# Patient Record
Sex: Male | Born: 1955 | Race: White | Hispanic: No | Smoking: Former smoker
Health system: Southern US, Community
[De-identification: ages and names within clinical notes are randomized; demographics above are authoritative.]

## PROBLEM LIST (undated history)

## (undated) DIAGNOSIS — G4730 Sleep apnea, unspecified: Secondary | ICD-10-CM

## (undated) DIAGNOSIS — Z87442 Personal history of urinary calculi: Secondary | ICD-10-CM

## (undated) DIAGNOSIS — H409 Unspecified glaucoma: Secondary | ICD-10-CM

## (undated) DIAGNOSIS — C210 Malignant neoplasm of anus, unspecified: Secondary | ICD-10-CM

## (undated) DIAGNOSIS — E039 Hypothyroidism, unspecified: Secondary | ICD-10-CM

## (undated) DIAGNOSIS — J449 Chronic obstructive pulmonary disease, unspecified: Secondary | ICD-10-CM

## (undated) HISTORY — DX: Malignant neoplasm of anus, unspecified: C21.0

## (undated) HISTORY — PX: SKIN GRAFT: SHX250

## (undated) HISTORY — DX: Hypothyroidism, unspecified: E03.9

## (undated) HISTORY — PX: LEG SURGERY: SHX1003

## (undated) HISTORY — PX: EYE SURGERY: SHX253

---

## 2010-12-04 DIAGNOSIS — C210 Malignant neoplasm of anus, unspecified: Secondary | ICD-10-CM | POA: Insufficient documentation

## 2010-12-04 HISTORY — DX: Malignant neoplasm of anus, unspecified: C21.0

## 2018-05-13 ENCOUNTER — Encounter: Payer: Self-pay | Admitting: Internal Medicine

## 2018-08-08 ENCOUNTER — Ambulatory Visit (INDEPENDENT_AMBULATORY_CARE_PROVIDER_SITE_OTHER): Payer: Medicare Other | Admitting: Gastroenterology

## 2018-08-08 ENCOUNTER — Telehealth: Payer: Self-pay

## 2018-08-08 ENCOUNTER — Encounter: Payer: Self-pay | Admitting: Gastroenterology

## 2018-08-08 ENCOUNTER — Other Ambulatory Visit: Payer: Self-pay

## 2018-08-08 DIAGNOSIS — C210 Malignant neoplasm of anus, unspecified: Secondary | ICD-10-CM

## 2018-08-08 DIAGNOSIS — Z860101 Personal history of adenomatous and serrated colon polyps: Secondary | ICD-10-CM

## 2018-08-08 DIAGNOSIS — K921 Melena: Secondary | ICD-10-CM | POA: Diagnosis not present

## 2018-08-08 DIAGNOSIS — R1310 Dysphagia, unspecified: Secondary | ICD-10-CM | POA: Diagnosis not present

## 2018-08-08 DIAGNOSIS — R1319 Other dysphagia: Secondary | ICD-10-CM

## 2018-08-08 DIAGNOSIS — Z86010 Personal history of colonic polyps: Secondary | ICD-10-CM | POA: Diagnosis not present

## 2018-08-08 MED ORDER — CLENPIQ 10-3.5-12 MG-GM -GM/160ML PO SOLN: 1.0000 | Freq: Once | ORAL | 0 refills | Status: AC

## 2018-08-08 NOTE — Patient Instructions (Signed)
Upper endoscopy and colonoscopy as scheduled. See separate instructions.  

## 2018-08-08 NOTE — Telephone Encounter (Signed)
Called and informed pt of pre-op appt 09/12/18 at 10:00am. Letter mailed.

## 2018-08-08 NOTE — Assessment & Plan Note (Signed)
Very pleasant 62 year old gentleman with presenting at the request of Dr. Quintin Alto for further evaluation of black loose stools.  He has history of anal cancer status post chemoradiation in 2012.  Last sigmoidoscopy in 2014 with tubular adenoma removed.  It is unclear when his last complete colonoscopy was. Patient denies surgical related to his anal cancer.  He complains of 2-3 black stools every morning, very loose and sticky, for the past 7 years.  Doubt we are dealing with chronic signficant bleeding.  There are no signs of significant anemia on exam.  Suspect related to his prior radiation. He is overdue for surveillance of his colon.  He also complains of solid food dysphagia.  Offered colonoscopy and upper endoscopy with esophageal dilation in the near future.  Given history of COPD, plan for deep sedation with help of anesthesiology.   I have discussed the risks, alternatives, benefits with regards to but not limited to the risk of reaction to medication, bleeding, infection, perforation and the patient is agreeable to proceed. Written consent to be obtained.
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
## 2018-08-08 NOTE — Progress Notes (Signed)
CC'D TO PCP °

## 2018-09-09 NOTE — Patient Instructions (Addendum)
Your procedure is scheduled on: 09/16/2018  Report to [facility] at    6:15 AM.  Call this number if you have problems the morning of surgery: [phone]   Remember:              Follow Directions on the letter you received from Your Physician's office regarding the Bowel Prep  :  Take these medicines the morning of surgery with A SIP OF WATER: None   Do not wear jewelry, make-up or nail polish.    Do not bring valuables to the hospital.  Contacts, dentures or bridgework may not be worn into surgery.  .   Patients discharged the day of surgery will not be allowed to drive home.     Colonoscopy, Adult, Care After This sheet gives you information about how to care for yourself after your procedure. Your health care provider may also give you more specific instructions. If you have problems or questions, contact your health care provider. What can I expect after the procedure? After the procedure, it is common to have:  A small amount of blood in your stool for 24 hours after the procedure.  Some gas.  Mild abdominal cramping or bloating.  Follow these instructions at home: General instructions   For the first 24 hours after the procedure: ? Do not drive or use machinery. ? Do not sign important documents. ? Do not drink alcohol. ? Do your regular daily activities at a slower pace than normal. ? Eat soft, easy-to-digest foods. ? Rest often.  Take over-the-counter or prescription medicines only as told by your health care provider.  It is up to you to get the results of your procedure. Ask your health care provider, or the department performing the procedure, when your results will be ready. Relieving cramping and bloating  Try walking around when you have cramps or feel bloated.  Apply heat to your abdomen as told by your health care provider. Use a heat source that your health care provider recommends, such as a moist heat pack or a heating pad. ? Place a towel  between your skin and the heat source. ? Leave the heat on for 20-30 minutes. ? Remove the heat if your skin turns bright red. This is especially important if you are unable to feel pain, heat, or cold. You may have a greater risk of getting burned. Eating and drinking  Drink enough fluid to keep your urine clear or pale yellow.  Resume your normal diet as instructed by your health care provider. Avoid heavy or fried foods that are hard to digest.  Avoid drinking alcohol for as long as instructed by your health care provider. Contact a health care provider if:  You have blood in your stool 2-3 days after the procedure. Get help right away if:  You have more than a small spotting of blood in your stool.  You pass large blood clots in your stool.  Your abdomen is swollen.  You have nausea or vomiting.  You have a fever.  You have increasing abdominal pain that is not relieved with medicine. This information is not intended to replace advice given to you by your health care provider. Make sure you discuss any questions you have with your health care provider. Document Released: 07/04/2004 Document Revised: 08/14/2016 Document Reviewed: 02/01/2016 Elsevier Interactive Patient Education  2018 [place]. Gastrointestinal Endoscopy, Care After Refer to this sheet in the next few weeks. These instructions provide you with information about  caring for yourself after your procedure. Your health care provider may also give you more specific instructions. Your treatment has been planned according to current medical practices, but problems sometimes occur. Call your health care provider if you have any problems or questions after your procedure. What can I expect after the procedure? After your procedure, it is common to feel:  Bloated.  Soreness in your throat.  Sleepy.  Follow these instructions at home:  Do not drive for 24 hours if you received a if you received a medicine to  help you relax (sedative).  Avoid drinking warm beverages and alcohol for the first 24 hours after the procedure.  Take over-the-counter and prescription medicines only as told by your health care provider.  Drink enough fluids to keep your urine clear or pale yellow.  If you feel bloated, try going for a walk. Walking may help the feeling go away.  If your throat is sore, try gargling with salt water. Get help right away if:  You have severe nausea or vomiting.  You have severe abdominal pain, abdominal cramps that last longer than 6 hours, or abdominal swelling.  You have severe shoulder or back pain.  You have trouble swallowing.  You have shortness of breath, your breathing is shallow, or you breathing is faster than normal.  You have a fever.  Your heart is beating very fast.  You vomit blood or material that looks like coffee grounds.  You have bloody, black, or tarry stools. This information is not intended to replace advice given to you by your health care provider. Make sure you discuss any questions you have with your health care provider. Document Released: 07/04/2004 Document Revised: 09/27/2016 Document Reviewed: 09/12/2015 Elsevier Interactive Patient Education  2018 [employer].  Esophageal Dilatation Esophageal dilatation is a procedure to open a blocked or narrowed part of the esophagus. The esophagus is the long tube in your throat that carries food and liquid from your mouth to your stomach. The procedure is also called esophageal dilation. You may need this procedure if you have a buildup of scar tissue in your esophagus that makes it difficult, painful, or even impossible to swallow. This can be caused by gastroesophageal reflux disease (GERD). In rare cases, people need this procedure because they have cancer of the esophagus or a problem with the way food moves through the esophagus. Sometimes you may need to have another dilatation to enlarge the opening  of the esophagus gradually. Tell a health care provider about:  Any allergies you have.  All medicines you are taking, including vitamins, herbs, eye drops, creams, and over-the-counter medicines.  Any problems you or family members have had with anesthetic medicines.  Any blood disorders you have.  Any surgeries you have had.  Any medical conditions you have.  Any antibiotic medicines you are required to take before dental procedures. What are the risks? Generally, this is a safe procedure. However, problems can occur and include:  Bleeding from a tear in the lining of the esophagus.  A hole (perforation) in the esophagus.  What happens before the procedure?  Do not eat or drink anything after midnight on the night before the procedure or as directed by your health care provider.  Ask your health care provider about changing or stopping your regular medicines. This is especially important if you are taking diabetes medicines or blood thinners.  Plan to have someone take you home after the procedure. What happens during the procedure?  You  will be given a medicine that makes you relaxed and sleepy (sedative).  A medicine may be sprayed or gargled to numb the back of the throat.  Your health care provider can use various instruments to do an esophageal dilatation. During the procedure, the instrument used will be placed in your mouth and passed down into your esophagus. Options include: ? Simple dilators. This instrument is carefully placed in the esophagus to stretch it. ? Guided wire bougies. In this method, a flexible tube (endoscope) is used to insert a wire into the esophagus. The dilator is passed over this wire to enlarge the esophagus. Then the wire is removed. ? Balloon dilators. An endoscope with a small balloon at the end is passed down into the esophagus. Inflating the balloon gently stretches the esophagus and opens it up. What happens after the procedure?  Your  blood pressure, heart rate, breathing rate, and blood oxygen level will be monitored often until the medicines you were given have worn off.  Your throat may feel slightly sore and will probably still feel numb. This will improve slowly over time.  You will not be allowed to eat or drink until the throat numbness has resolved.  If this is a same-day procedure, you may be allowed to go home once you have been able to drink, urinate, and sit on the edge of the bed without nausea or dizziness.  Monitored Anesthesia Care, Care After These instructions provide you with information about caring for yourself after your procedure. Your health care provider may also give you more specific instructions. Your treatment has been planned according to current medical practices, but problems sometimes occur. Call your health care provider if you have any problems or questions after your procedure. What can I expect after the procedure? After your procedure, it is common to: Feel sleepy for several hours. Feel clumsy and have poor balance for several hours. Feel forgetful about what happened after the procedure. Have poor judgment for several hours. Feel nauseous or vomit. Have a sore throat if you had a breathing tube during the procedure.  Follow these instructions at home: For at least 24 hours after the procedure:  Do not: Participate in activities in which you could fall or become injured. Drive. Use heavy machinery. Drink alcohol. Take sleeping pills or medicines that cause drowsiness. Make important decisions or sign legal documents. Take care of children on your own. Rest. Eating and drinking Follow the diet that is recommended by your health care provider. If you vomit, drink water, juice, or soup when you can drink without vomiting. Make sure you have little or no nausea before eating solid foods. General instructions Have a responsible adult stay with you until you are awake and  alert. Take over-the-counter and prescription medicines only as told by your health care provider. If you smoke, do not smoke without supervision. Keep all follow-up visits as told by your health care provider. This is important. Contact a health care provider if: You keep feeling nauseous or you keep vomiting. You feel light-headed. You develop a rash. You have a fever. Get help right away if: You have trouble breathing. This information is not intended to replace advice given to you by your health care provider. Make sure you discuss any questions you have with your health care provider. Document Released: 03/12/2016 Document Revised: 07/12/2016 Document Reviewed: 03/12/2016 Elsevier Interactive Patient Education  [employer].    If this is a same-day procedure, you should have a friend  or family member with you for the next 24 hours after the procedure. This information is not intended to replace advice given to you by your health care provider. Make sure you discuss any questions you have with your health care provider. Document Released: 01/11/2006 Document Revised: 04/27/2016 Document Reviewed: 04/01/2014 Elsevier Interactive Patient Education  2018 [place], Care After These instructions provide you with information about caring for yourself after your procedure. Your health care provider may also give you more specific instructions. Your treatment has been planned according to current medical practices, but problems sometimes occur. Call your health care provider if you have any problems or questions after your procedure. What can I expect after the procedure? After your procedure, it is common to:  Feel sleepy for several hours.  Feel clumsy and have poor balance for several hours.  Feel forgetful about what happened after the procedure.  Have poor judgment for several hours.  Feel nauseous or vomit.  Have a sore throat if you  had a breathing tube during the procedure.  Follow these instructions at home: For at least 24 hours after the procedure:   Do not: ? Participate in activities in which you could fall or become injured. ? Drive. ? Use heavy machinery. ? Drink alcohol. ? Take sleeping pills or medicines that cause drowsiness. ? Make important decisions or sign legal documents. ? Take care of children on your own.  Rest. Eating and drinking  Follow the diet that is recommended by your health care provider.  If you vomit, drink water, juice, or soup when you can drink without vomiting.  Make sure you have little or no nausea before eating solid foods. General instructions  Have a responsible adult stay with you until you are awake and alert.  Take over-the-counter and prescription medicines only as told by your health care provider.  If you smoke, do not smoke without supervision.  Keep all follow-up visits as told by your health care provider. This is important. Contact a health care provider if:  You keep feeling nauseous or you keep vomiting.  You feel light-headed.  You develop a rash.  You have a fever. Get help right away if:  You have trouble breathing. This information is not intended to replace advice given to you by your health care provider. Make sure you discuss any questions you have with your health care provider. Document Released: 03/12/2016 Document Revised: 07/12/2016 Document Reviewed: 03/12/2016 Elsevier Interactive Patient Education  [employer].

## 2018-09-12 ENCOUNTER — Encounter (HOSPITAL_COMMUNITY)
Admission: RE | Admit: 2018-09-12 | Discharge: 2018-09-12 | Disposition: A | Discharge status: Home / Self Care | Payer: Medicare Other | Source: Ambulatory Visit | Attending: Internal Medicine | Admitting: Internal Medicine

## 2018-09-12 ENCOUNTER — Other Ambulatory Visit: Payer: Self-pay

## 2018-09-12 ENCOUNTER — Encounter (HOSPITAL_COMMUNITY): Payer: Self-pay

## 2018-09-12 DIAGNOSIS — Z01818 Encounter for other preprocedural examination: Secondary | ICD-10-CM | POA: Insufficient documentation

## 2018-09-12 HISTORY — DX: Personal history of urinary calculi: Z87.442

## 2018-09-12 HISTORY — DX: Sleep apnea, unspecified: G47.30

## 2018-09-12 HISTORY — DX: Chronic obstructive pulmonary disease, unspecified: J44.9

## 2018-09-12 HISTORY — DX: Unspecified glaucoma: H40.9

## 2018-09-12 LAB — CBC WITH DIFFERENTIAL/PLATELET
ABS IMMATURE GRANULOCYTES: 0.03 10*3/uL (ref 0.00–0.07)
BASOS ABS: 0.1 10*3/uL (ref 0.0–0.1)
Basophils Relative: 1 %
EOS ABS: 0.1 10*3/uL (ref 0.0–0.5)
Eosinophils Relative: 1 %
HCT: 45.2 % (ref 39.0–52.0)
HEMOGLOBIN: 14.1 g/dL (ref 13.0–17.0)
IMMATURE GRANULOCYTES: 0 %
LYMPHS ABS: 0.6 10*3/uL — AB (ref 0.7–4.0)
LYMPHS PCT: 9 %
MCH: 30.7 pg (ref 26.0–34.0)
MCHC: 31.2 g/dL (ref 30.0–36.0)
MCV: 98.5 fL (ref 80.0–100.0)
MONOS PCT: 14 %
Monocytes Absolute: 0.9 10*3/uL (ref 0.1–1.0)
NEUTROS ABS: 5.1 10*3/uL (ref 1.7–7.7)
NRBC: 0 % (ref 0.0–0.2)
Neutrophils Relative %: 75 %
Platelets: 250 10*3/uL (ref 150–400)
RBC: 4.59 MIL/uL (ref 4.22–5.81)
RDW: 12.2 % (ref 11.5–15.5)
WBC: 6.7 10*3/uL (ref 4.0–10.5)

## 2018-09-12 LAB — BASIC METABOLIC PANEL
ANION GAP: 9 (ref 5–15)
BUN: 14 mg/dL (ref 6–20)
CALCIUM: 9 mg/dL (ref 8.9–10.3)
CO2: 28 mmol/L (ref 22–32)
Chloride: 101 mmol/L (ref 98–111)
Creatinine, Ser: 0.85 mg/dL (ref 0.61–1.24)
GFR calc non Af Amer: >60 mL/min (ref >60)
Glucose, Bld: 86 mg/dL (ref 70–99)
POTASSIUM: 3.6 mmol/L (ref 3.5–5.1)
Sodium: 138 mmol/L (ref 135–145)

## 2018-09-16 ENCOUNTER — Ambulatory Visit (HOSPITAL_COMMUNITY): Payer: Medicare Other | Admitting: Anesthesiology

## 2018-09-16 ENCOUNTER — Ambulatory Visit (HOSPITAL_COMMUNITY)
Admission: RE | Admit: 2018-09-16 | Discharge: 2018-09-16 | Disposition: A | Discharge status: Home / Self Care | Payer: Medicare Other | Source: Ambulatory Visit | Attending: Internal Medicine | Admitting: Internal Medicine

## 2018-09-16 ENCOUNTER — Other Ambulatory Visit: Payer: Self-pay

## 2018-09-16 ENCOUNTER — Encounter (HOSPITAL_COMMUNITY): Payer: Self-pay | Admitting: Anesthesiology

## 2018-09-16 ENCOUNTER — Encounter (HOSPITAL_COMMUNITY)
Admission: RE | Disposition: A | Discharge status: Home / Self Care | Payer: Self-pay | Source: Ambulatory Visit | Attending: Internal Medicine

## 2018-09-16 DIAGNOSIS — K2950 Unspecified chronic gastritis without bleeding: Secondary | ICD-10-CM | POA: Diagnosis not present

## 2018-09-16 DIAGNOSIS — Z86010 Personal history of colonic polyps: Secondary | ICD-10-CM | POA: Insufficient documentation

## 2018-09-16 DIAGNOSIS — K222 Esophageal obstruction: Secondary | ICD-10-CM | POA: Diagnosis not present

## 2018-09-16 DIAGNOSIS — D123 Benign neoplasm of transverse colon: Secondary | ICD-10-CM | POA: Diagnosis not present

## 2018-09-16 DIAGNOSIS — Z1211 Encounter for screening for malignant neoplasm of colon: Secondary | ICD-10-CM | POA: Diagnosis not present

## 2018-09-16 DIAGNOSIS — F17210 Nicotine dependence, cigarettes, uncomplicated: Secondary | ICD-10-CM | POA: Insufficient documentation

## 2018-09-16 DIAGNOSIS — Z9114 Patient's other noncompliance with medication regimen: Secondary | ICD-10-CM | POA: Insufficient documentation

## 2018-09-16 DIAGNOSIS — K449 Diaphragmatic hernia without obstruction or gangrene: Secondary | ICD-10-CM | POA: Diagnosis not present

## 2018-09-16 DIAGNOSIS — J449 Chronic obstructive pulmonary disease, unspecified: Secondary | ICD-10-CM | POA: Diagnosis not present

## 2018-09-16 DIAGNOSIS — G4730 Sleep apnea, unspecified: Secondary | ICD-10-CM | POA: Insufficient documentation

## 2018-09-16 DIAGNOSIS — Z85048 Personal history of other malignant neoplasm of rectum, rectosigmoid junction, and anus: Secondary | ICD-10-CM | POA: Insufficient documentation

## 2018-09-16 DIAGNOSIS — K219 Gastro-esophageal reflux disease without esophagitis: Secondary | ICD-10-CM | POA: Insufficient documentation

## 2018-09-16 DIAGNOSIS — K2970 Gastritis, unspecified, without bleeding: Secondary | ICD-10-CM | POA: Diagnosis not present

## 2018-09-16 DIAGNOSIS — K3189 Other diseases of stomach and duodenum: Secondary | ICD-10-CM | POA: Diagnosis not present

## 2018-09-16 DIAGNOSIS — E039 Hypothyroidism, unspecified: Secondary | ICD-10-CM | POA: Insufficient documentation

## 2018-09-16 HISTORY — PX: POLYPECTOMY: SHX5525

## 2018-09-16 HISTORY — PX: COLONOSCOPY WITH PROPOFOL: SHX5780

## 2018-09-16 HISTORY — PX: BIOPSY: SHX5522

## 2018-09-16 HISTORY — PX: ESOPHAGOGASTRODUODENOSCOPY (EGD) WITH PROPOFOL: SHX5813

## 2018-09-16 HISTORY — PX: MALONEY DILATION: SHX5535

## 2018-09-16 SURGERY — COLONOSCOPY WITH PROPOFOL
Anesthesia: Monitor Anesthesia Care

## 2018-09-16 MED ORDER — SUCCINYLCHOLINE CHLORIDE 20 MG/ML IJ SOLN
INTRAMUSCULAR | Status: AC
  Filled 2018-09-16: qty 1

## 2018-09-16 MED ORDER — GLYCOPYRROLATE 0.2 MG/ML IJ SOLN
INTRAMUSCULAR | Status: AC
  Filled 2018-09-16: qty 1

## 2018-09-16 MED ORDER — EPHEDRINE SULFATE 50 MG/ML IJ SOLN
INTRAMUSCULAR | Status: AC
  Filled 2018-09-16: qty 1

## 2018-09-16 MED ORDER — LIDOCAINE HCL (PF) 1 % IJ SOLN
INTRAMUSCULAR | Status: AC
  Filled 2018-09-16: qty 5

## 2018-09-16 MED ORDER — LACTATED RINGERS IV SOLN: INTRAVENOUS | Status: DC

## 2018-09-16 MED ORDER — GLYCOPYRROLATE 0.2 MG/ML IJ SOLN
INTRAMUSCULAR | Status: DC | PRN
  Administered 2018-09-16: 0.2 mg via INTRAVENOUS

## 2018-09-16 MED ORDER — LACTATED RINGERS IV SOLN
INTRAVENOUS | Status: DC | PRN
  Administered 2018-09-16: 08:00:00 via INTRAVENOUS

## 2018-09-16 MED ORDER — SODIUM CHLORIDE 0.9 % IJ SOLN
INTRAMUSCULAR | Status: AC
  Filled 2018-09-16: qty 10

## 2018-09-16 MED ORDER — PROPOFOL 500 MG/50ML IV EMUL
INTRAVENOUS | Status: DC | PRN
  Administered 2018-09-16: 150 ug/kg/min via INTRAVENOUS

## 2018-09-16 MED ORDER — LIDOCAINE HCL (CARDIAC) PF 50 MG/5ML IV SOSY
PREFILLED_SYRINGE | INTRAVENOUS | Status: DC | PRN
  Administered 2018-09-16: 40 mg via INTRAVENOUS

## 2018-09-16 MED ORDER — PROPOFOL 10 MG/ML IV BOLUS
INTRAVENOUS | Status: DC | PRN
  Administered 2018-09-16 (×2): 30 mg via INTRAVENOUS

## 2018-09-16 MED ORDER — PROPOFOL 10 MG/ML IV BOLUS
INTRAVENOUS | Status: AC
  Filled 2018-09-16: qty 140

## 2018-09-16 MED ORDER — PROPOFOL 10 MG/ML IV BOLUS
INTRAVENOUS | Status: AC
  Filled 2018-09-16: qty 80
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
## 2018-09-16 NOTE — Op Note (Signed)
[facility] Patient Name: Joel Cobb Procedure Date: 09/16/2018 7:48 AM MRN: [id number] Date of Birth: 11-Feb-1956 Attending MD: Norvel Richards , MD CSN: [id number] Age: 62 Admit Type: Outpatient Procedure:                Colonoscopy Indications:              High risk colon cancer surveillance: Personal                            history of colonic polyps Providers:                Norvel Richards, MD, Janeece Riggers, RN, Nelma Rothman, Technician Referring MD:              Medicines:                Propofol per Anesthesia Complications:            No immediate complications. Estimated Blood Loss:     Estimated blood loss was minimal. Procedure:                Pre-Anesthesia Assessment:                           - Prior to the procedure, a History and Physical                            was performed, and patient medications and                            allergies were reviewed. The patient's tolerance of                            previous anesthesia was also reviewed. The risks                            and benefits of the procedure and the sedation                            options and risks were discussed with the patient.                            All questions were answered, and informed consent                            was obtained. Prior Anticoagulants: The patient has                            taken no previous anticoagulant or antiplatelet                            agents. ASA Grade Assessment: II - A patient with  mild systemic disease. After reviewing the risks                            and benefits, the patient was deemed in                            satisfactory condition to undergo the procedure.                           After obtaining informed consent, the colonoscope                            was passed under direct vision. Throughout the                            procedure, the patient's  blood pressure, pulse, and                            oxygen saturations were monitored continuously. The                            CF-HQ190L ([id number]) scope was introduced through                            the and advanced to the the cecum, identified by                            appendiceal orifice and ileocecal valve. The                            colonoscopy was performed without difficulty. The                            patient tolerated the procedure well. The quality                            of the bowel preparation was adequate. The                            ileocecal valve, appendiceal orifice, and rectum                            were photographed. The entire colon was well                            visualized. Scope In: 7:52:32 AM Scope Out: 8:13:17 AM Scope Withdrawal Time: 0 hours 10 minutes 11 seconds  Total Procedure Duration: 0 hours 20 minutes 45 seconds  Findings:      The perianal and digital rectal examinations were normal. Some perianal       scarring presumably from radiation noted. An Acute turn in the mid       sigmoid necessitated obtaining the pediatric scope to advance to the       cecum.      A 7 mm polyp was  found in the hepatic flexure. The polyp was sessile.       The polyp was removed with a cold snare. Resection and retrieval were       complete. Estimated blood loss was minimal.      The exam was otherwise without abnormality on direct views. Rectal vault       appeared somewhat small. Again, distal scarring presumably from       radiation noted with minimal neovascular changes. Did not attempt       retroflexed. No evidence of persisting tumor. Otherwise, rectal mucosa       appeared normal. And retroflexion views. Impression:               - One 7 mm polyp at the hepatic flexure, removed                            with a cold snare. Resected and retrieved.                           - The examination was otherwise normal on direct                             and retroflexion views. Anorectal scarring as                            outlined above Moderate Sedation:      Moderate (conscious) sedation was personally administered by an       anesthesia professional. The following parameters were monitored: oxygen       saturation, heart rate, blood pressure, respiratory rate, EKG, adequacy       of pulmonary ventilation, and response to care. Recommendation:           - Patient has a contact number available for                            emergencies. The signs and symptoms of potential                            delayed complications were discussed with the                            patient. Return to normal activities tomorrow.                            Written discharge instructions were provided to the                            patient.                           - Advance diet as tolerated.                           - Continue present medications.                           - Repeat colonoscopy date to be determined after  pending pathology results are reviewed for                            surveillance based on pathology results.                           - Return to GI office in 3 months. See EGD report. Procedure Code(s):        --- Professional ---                           [phone], Colonoscopy, flexible; with removal of                            tumor(s), polyp(s), or other lesion(s) by snare                            technique Diagnosis Code(s):        --- Professional ---                           Z86.010, Personal history of colonic polyps                           D12.3, Benign neoplasm of transverse colon (hepatic                            flexure or splenic flexure) CPT copyright 2018 American Medical Association. All rights reserved. The codes documented in this report are preliminary and upon coder review may  be revised to meet current compliance requirements. Cristopher Estimable. Voshon Petro,  MD Norvel Richards, MD 09/16/2018 8:23:49 AM This report has been signed electronically. Number of Addenda: 0

## 2018-09-16 NOTE — Addendum Note (Signed)
Addendum  created 09/16/18 0826 by Nicanor Alcon, MD   Attestation recorded in [place], [place] filed

## 2018-09-16 NOTE — Anesthesia Procedure Notes (Signed)
Procedure Name: MAC Date/Time: 09/16/2018 7:32 AM Performed by: Andree Elk Amy A, CRNA Pre-anesthesia Checklist: Patient identified, Emergency Drugs available, Suction available, Patient being monitored and Timeout performed Oxygen Delivery Method: Simple face mask

## 2018-09-16 NOTE — Discharge Instructions (Signed)
EGD Discharge instructions Please read the instructions outlined below and refer to this sheet in the next few weeks. These discharge instructions provide you with general information on caring for yourself after you leave the hospital. Your doctor may also give you specific instructions. While your treatment has been planned according to the most current medical practices available, unavoidable complications occasionally occur. If you have any problems or questions after discharge, please call your doctor. ACTIVITY  You may resume your regular activity but move at a slower pace for the next 24 hours.   Take frequent rest periods for the next 24 hours.   Walking will help expel (get rid of) the air and reduce the bloated feeling in your abdomen.   No driving for 24 hours (because of the anesthesia (medicine) used during the test).   You may shower.   Do not sign any important legal documents or operate any machinery for 24 hours (because of the anesthesia used during the test).  NUTRITION  Drink plenty of fluids.   You may resume your normal diet.   Begin with a light meal and progress to your normal diet.   Avoid alcoholic beverages for 24 hours or as instructed by your caregiver.  MEDICATIONS  You may resume your normal medications unless your caregiver tells you otherwise.  WHAT YOU CAN EXPECT TODAY  You may experience abdominal discomfort such as a feeling of fullness or gas pains.  FOLLOW-UP  Your doctor will discuss the results of your test with you.  SEEK IMMEDIATE MEDICAL ATTENTION IF ANY OF THE FOLLOWING OCCUR:  Excessive nausea (feeling sick to your stomach) and/or vomiting.   Severe abdominal pain and distention (swelling).   Trouble swallowing.   Temperature over 101 F (37.8 C).   Rectal bleeding or vomiting of blood.   Colonoscopy Discharge Instructions  Read the instructions outlined below and refer to this sheet in the next few weeks. These  discharge instructions provide you with general information on caring for yourself after you leave the hospital. Your doctor may also give you specific instructions. While your treatment has been planned according to the most current medical practices available, unavoidable complications occasionally occur. If you have any problems or questions after discharge, call Dr. Gala Romney at [phone]. ACTIVITY  You may resume your regular activity, but move at a slower pace for the next 24 hours.   Take frequent rest periods for the next 24 hours.   Walking will help get rid of the air and reduce the bloated feeling in your belly (abdomen).   No driving for 24 hours (because of the medicine (anesthesia) used during the test).    Do not sign any important legal documents or operate any machinery for 24 hours (because of the anesthesia used during the test).  NUTRITION  Drink plenty of fluids.   You may resume your normal diet as instructed by your doctor.   Begin with a light meal and progress to your normal diet. Heavy or fried foods are harder to digest and may make you feel sick to your stomach (nauseated).   Avoid alcoholic beverages for 24 hours or as instructed.  MEDICATIONS  You may resume your normal medications unless your doctor tells you otherwise.  WHAT YOU CAN EXPECT TODAY  Some feelings of bloating in the abdomen.   Passage of more gas than usual.   Spotting of blood in your stool or on the toilet paper.  IF YOU HAD POLYPS REMOVED DURING THE COLONOSCOPY:  No aspirin products for 7 days or as instructed.   No alcohol for 7 days or as instructed.   Eat a soft diet for the next 24 hours.  FINDING OUT THE RESULTS OF YOUR TEST Not all test results are available during your visit. If your test results are not back during the visit, make an appointment with your caregiver to find out the results. Do not assume everything is normal if you have not heard from your caregiver or the  medical facility. It is important for you to follow up on all of your test results.  SEEK IMMEDIATE MEDICAL ATTENTION IF:  You have more than a spotting of blood in your stool.   Your belly is swollen (abdominal distention).   You are nauseated or vomiting.   You have a temperature over 101.   You have abdominal pain or discomfort that is severe or gets worse throughout the day.    GERD and colon polyp information provided  Begin Protonix 40 mg daily  Office visit in 3 months  Further recommendations to follow pending review of pathology report

## 2018-09-16 NOTE — Transfer of Care (Signed)
Immediate Anesthesia Transfer of Care Note  Patient: Joel Cobb  Procedure(s) Performed: COLONOSCOPY WITH PROPOFOL (N/A ) ESOPHAGOGASTRODUODENOSCOPY (EGD) WITH PROPOFOL (N/A ) MALONEY DILATION (N/A ) BIOPSY POLYPECTOMY  Patient Location: PACU  Anesthesia Type:MAC  Level of Consciousness: awake, alert  and oriented  Airway & Oxygen Therapy: Patient Spontanous Breathing  Post-op Assessment: Report given to RN and Post -op Vital signs reviewed and stable  Post vital signs: Reviewed and stable  Last Vitals:  Vitals Value Taken Time  BP    Temp    Pulse 39 09/16/2018  8:21 AM  Resp    SpO2 90 % 09/16/2018  8:21 AM  Vitals shown include unvalidated device data.  Last Pain:  Vitals:   09/16/18 0735  TempSrc:   PainSc: 4       Patients Stated Pain Goal: 8 ([id number])  Complications: No apparent anesthesia complications

## 2018-09-16 NOTE — Op Note (Signed)
[facility] Patient Name: Joel Cobb Procedure Date: 09/16/2018 7:32 AM MRN: [id number] Date of Birth: [birth year] Attending MD: Norvel Richards , MD CSN: [id number] Age: 62 Admit Type: Outpatient Procedure:                Upper GI endoscopy Indications:              Dysphagia, Melena Providers:                Norvel Richards, MD, Janeece Riggers, RN, Nelma Rothman, Technician Referring MD:              Medicines:                Propofol per Anesthesia Complications:            No immediate complications. Estimated Blood Loss:     Estimated blood loss was minimal. Procedure:                Pre-Anesthesia Assessment:                           - Prior to the procedure, a History and Physical                            was performed, and patient medications and                            allergies were reviewed. The patient's tolerance of                            previous anesthesia was also reviewed. The risks                            and benefits of the procedure and the sedation                            options and risks were discussed with the patient.                            All questions were answered, and informed consent                            was obtained. Prior Anticoagulants: The patient has                            taken no previous anticoagulant or antiplatelet                            agents. ASA Grade Assessment: II - A patient with                            mild systemic disease. After reviewing the risks  and benefits, the patient was deemed in                            satisfactory condition to undergo the procedure.                           After obtaining informed consent, the endoscope was                            passed under direct vision. Throughout the                            procedure, the patient's blood pressure, pulse, and                            oxygen saturations were  monitored continuously. The                            [id number]-[id number] ([id number]) scope was introduced through the                            and advanced to the second part of duodenum. The                            upper GI endoscopy was accomplished without                            difficulty. The patient tolerated the procedure                            well. Scope In: 7:38:31 AM Scope Out: 8:25:05 AM Total Procedure Duration: 0 hours 7 minutes 43 seconds  Findings:      Schatzki's ring present. No tumor. No Barrett's epithelium. No       esophagitis. Small hiatal hernia.      Two localized 7 mm erosions were found in the gastric antrum.      The duodenal bulb and second portion of the duodenum were normal. The       scope was withdrawn. A 56 French Maloney dilator passed with mild       resistance. Subsequently, a 68 French dilator was passed with mild       resistance. A look back revealed nice disruption without apparent       complication. Finally, abnormal gastric mucosa was biopsied with cold       forceps for histology. Estimated blood loss was minimal. Impression:               - Mild Schatzki ring. Dilated                           - Erosive gastropathy. Biopsied.                           - Normal duodenal bulb and second portion of the  duodenum. Moderate Sedation:      Moderate (conscious) sedation was personally administered by an       anesthesia professional. The following parameters were monitored: oxygen       saturation, heart rate, blood pressure, and response to care. Recommendation:           - Patient has a contact number available for                            emergencies. The signs and symptoms of potential                            delayed complications were discussed with the                            patient. Return to normal activities tomorrow.                            Written discharge instructions were provided to the                             patient.                           - Resume previous diet.                           - Continue present medications. Begin Protonix 40                            mg daily. See colonoscopy report.                           - Await pathology results.                           - No repeat upper endoscopy.                           - Return to GI office in 3 months. Procedure Code(s):        --- Professional ---                           [phone], Esophagogastroduodenoscopy, flexible,                            transoral; with biopsy, single or multiple Diagnosis Code(s):        --- Professional ---                           K22.2, Esophageal obstruction                           K31.89, Other diseases of stomach and duodenum                           R13.10, Dysphagia, unspecified  K92.1, Melena (includes Hematochezia) CPT copyright 2018 American Medical Association. All rights reserved. The codes documented in this report are preliminary and upon coder review may  be revised to meet current compliance requirements. Cristopher Estimable. Benjaman Artman, MD Norvel Richards, MD 09/16/2018 7:49:59 AM This report has been signed electronically. Number of Addenda: 0

## 2018-09-16 NOTE — Anesthesia Preprocedure Evaluation (Signed)
Anesthesia Evaluation  Patient identified by MRN, date of birth, ID band Patient awake    Reviewed: Allergy & Precautions, H&P , NPO status , Patient's Chart, lab work & pertinent test results, reviewed documented beta blocker date and time   Airway Mallampati: I  TM Distance: >3 FB Neck ROM: full    Dental  (+) Edentulous Upper, Edentulous Lower   Pulmonary sleep apnea , COPD, Current Smoker,    Pulmonary exam normal breath sounds clear to auscultation       Cardiovascular Exercise Tolerance: Good negative cardio ROS   Rhythm:regular Rate:Normal     Neuro/Psych negative neurological ROS  negative psych ROS   GI/Hepatic negative GI ROS, Neg liver ROS,   Endo/Other  Hypothyroidism   Renal/GU negative Renal ROS  negative genitourinary   Musculoskeletal   Abdominal   Peds  Hematology negative hematology ROS (+)   Anesthesia Other Findings Anal cancer (HCC)  Reproductive/Obstetrics negative OB ROS                             Anesthesia Physical Anesthesia Plan  ASA: III  Anesthesia Plan: MAC   Post-op Pain Management:    Induction:   PONV Risk Score and Plan:   Airway Management Planned:   Additional Equipment:   Intra-op Plan:   Post-operative Plan:   Informed Consent: I have reviewed the patients History and Physical, chart, labs and discussed the procedure including the risks, benefits and alternatives for the proposed anesthesia with the patient or authorized representative who has indicated his/her understanding and acceptance.   Dental Advisory Given  Plan Discussed with: CRNA  Anesthesia Plan Comments:         Anesthesia Quick Evaluation

## 2018-09-16 NOTE — Anesthesia Postprocedure Evaluation (Signed)
Anesthesia Post Note  Patient: Kanan Sobek  Procedure(s) Performed: COLONOSCOPY WITH PROPOFOL (N/A ) ESOPHAGOGASTRODUODENOSCOPY (EGD) WITH PROPOFOL (N/A ) MALONEY DILATION (N/A ) BIOPSY POLYPECTOMY  Patient location during evaluation: PACU Anesthesia Type: MAC Level of consciousness: awake and alert and oriented Pain management: pain level controlled Vital Signs Assessment: post-procedure vital signs reviewed and stable Respiratory status: spontaneous breathing Cardiovascular status: stable Postop Assessment: no apparent nausea or vomiting Anesthetic complications: no     Last Vitals:  Vitals:   09/16/18 0714  Temp: 36.8 C    Last Pain:  Vitals:   09/16/18 0735  TempSrc:   PainSc: 4                  ADAMS, AMY A

## 2018-09-23 ENCOUNTER — Encounter (HOSPITAL_COMMUNITY): Payer: Self-pay | Admitting: Internal Medicine

## 2018-09-24 ENCOUNTER — Encounter: Payer: Self-pay | Admitting: Internal Medicine

## 2018-10-09 ENCOUNTER — Encounter: Payer: Self-pay | Admitting: Internal Medicine

## 2019-01-03 ENCOUNTER — Ambulatory Visit (INDEPENDENT_AMBULATORY_CARE_PROVIDER_SITE_OTHER): Payer: Medicare Other | Admitting: Gastroenterology

## 2019-01-03 ENCOUNTER — Encounter: Payer: Self-pay | Admitting: Gastroenterology

## 2019-01-03 ENCOUNTER — Encounter: Payer: Self-pay | Admitting: *Deleted

## 2019-01-03 VITALS — BP 155/101 | HR 90 | Temp 96.6°F | Ht 69.0 in | Wt 136.0 lb

## 2019-01-03 DIAGNOSIS — R1310 Dysphagia, unspecified: Secondary | ICD-10-CM

## 2019-01-03 DIAGNOSIS — R1319 Other dysphagia: Secondary | ICD-10-CM

## 2019-01-03 DIAGNOSIS — K921 Melena: Secondary | ICD-10-CM | POA: Diagnosis not present

## 2019-01-03 NOTE — Patient Instructions (Signed)
1. Continue pantoprazole once daily to help heal the lining of your stomach and protect from ulcers.  2. Limit Aleve, Goody's/BCs, ibuprofen/Advil as they can lead to ulcers and bleeding. 3. Please have your labs and xray done. We will be in touch with results.

## 2019-01-03 NOTE — Progress Notes (Signed)
Primary Care Physician: Manon Hilding, MD  Primary Gastroenterologist:  Garfield Cornea, MD   Chief Complaint  Patient presents with  . Dysphagia    food gets stuck in throat  . Melena    HPI: Joel Cobb is a 63 y.o. male here for follow up.  Was seen back in September with complaints of black loose stools, dysphagia.  Here no improvement in swallowing. Pills sticking in the throat.  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  Underwent chemo and radiation, completed October 2012.  Flex sig September 2014 at [facility], 6 mm sessile polyp in the distal sigmoid, tubular adenoma.  No diverticulosis.  Tight angulation noted in the sigmoid colon requiring therapeutic EGD scope to complete exam.  There was mild radiation changes with it angiectasia at the dentate line, involving very little distal rectal tissue and more of the squamous mucosa of the anal canal.  Recent EGD with mild Schatzki ring status post dilation, erosive gastropathy with benign biopsies.  Colonoscopy with single lower adenoma removed from the hepatic flexure.  Next colonoscopy planned for 5 years.  Patient states he is really not any better.  Continues to have difficulty swallowing.  Symptoms back the very next day after dilation.  Bowel movement about every other day, solid to soft stool.  Continues to be black.  No bright red blood per rectum.  Chronic suprapubic abdominal pain since radiation.  No nausea or vomiting.  Denies heartburn.  Currently on Protonix.  Takes BCs or Goody's or Aleve when needed for pain, currently taking mostly Aleve about once daily.  Patient states most of his problems are financial.  He has trouble making ends meet.  Particularly running out of funds for food towards the end of the month.  Has been able to maintain his weight.  Lives on his son's property, lives in a camper.  Denies getting any type of support from his son.  Has reached out for  public assistance.   Current Outpatient Medications  Medication Sig Dispense Refill  . pantoprazole (PROTONIX) 40 MG tablet Take 1 tablet by mouth daily.     No current facility-administered medications for this visit.     Allergies as of 01/03/2019  . (No Known Allergies)    ROS:  General: Negative for anorexia, weight loss, fever, chills, fatigue, weakness. ENT: Negative for hoarseness,   nasal congestion. See hpi CV: Negative for chest pain, angina, palpitations, dyspnea on exertion, peripheral edema.  Respiratory: Negative for dyspnea at rest, dyspnea on exertion, cough, sputum, wheezing.  GI: See history of present illness. GU:  Negative for dysuria, hematuria, urinary incontinence, urinary frequency, nocturnal urination.  Endo: Negative for unusual weight change.    Physical Examination:   BP (!) 155/101   Pulse 90   Temp (!) 96.6 F (35.9 C) (Oral)   Ht 5\' 9"  (1.753 m)   Wt 136 lb (61.7 kg)   BMI 20.08 kg/m   General: Well-nourished, well-developed in no acute distress.  Eyes: No icterus. Mouth: Oropharyngeal mucosa moist and pink , no lesions erythema or exudate. Lungs: Clear to auscultation bilaterally.  Heart: Regular rate and rhythm, no murmurs rubs or gallops.  Abdomen: Bowel sounds are normal, nontender, nondistended, no hepatosplenomegaly or masses, no abdominal bruits or hernia , no rebound or guarding.   Extremities: No lower extremity edema. No clubbing or deformities. Neuro: Alert and oriented x 4   Skin:  Warm and dry, no jaundice.   Psych: Alert and cooperative, normal mood and affect.  Labs:  Lab Results  Component Value Date   CREATININE 0.85 09/12/2018   BUN 14 09/12/2018   NA 138 09/12/2018   K 3.6 09/12/2018   CL 101 09/12/2018   CO2 28 09/12/2018   Lab Results  Component Value Date   WBC 6.7 09/12/2018   HGB 14.1 09/12/2018   HCT 45.2 09/12/2018   MCV 98.5 09/12/2018   PLT 250 09/12/2018    Imaging Studies: No results  found.

## 2019-01-07 ENCOUNTER — Encounter: Payer: Self-pay | Admitting: Gastroenterology

## 2019-01-07 ENCOUNTER — Other Ambulatory Visit (HOSPITAL_COMMUNITY)
Admission: RE | Admit: 2019-01-07 | Discharge: 2019-01-07 | Disposition: A | Discharge status: Home / Self Care | Payer: Medicare Other | Source: Ambulatory Visit | Attending: Gastroenterology | Admitting: Gastroenterology

## 2019-01-07 ENCOUNTER — Ambulatory Visit (HOSPITAL_COMMUNITY)
Admission: RE | Admit: 2019-01-07 | Discharge: 2019-01-07 | Disposition: A | Discharge status: Home / Self Care | Payer: Medicare Other | Source: Ambulatory Visit | Attending: Gastroenterology | Admitting: Gastroenterology

## 2019-01-07 DIAGNOSIS — K921 Melena: Secondary | ICD-10-CM | POA: Diagnosis present

## 2019-01-07 DIAGNOSIS — R1310 Dysphagia, unspecified: Secondary | ICD-10-CM | POA: Diagnosis not present

## 2019-01-07 DIAGNOSIS — R1319 Other dysphagia: Secondary | ICD-10-CM

## 2019-01-07 LAB — CBC
HCT: 43.2 % (ref 39.0–52.0)
HEMOGLOBIN: 13.2 g/dL (ref 13.0–17.0)
MCH: 29.8 pg (ref 26.0–34.0)
MCHC: 30.6 g/dL (ref 30.0–36.0)
MCV: 97.5 fL (ref 80.0–100.0)
NRBC: 0 % (ref 0.0–0.2)
Platelets: 251 10*3/uL (ref 150–400)
RBC: 4.43 MIL/uL (ref 4.22–5.81)
RDW: 13.2 % (ref 11.5–15.5)
WBC: 6.4 10*3/uL (ref 4.0–10.5)

## 2019-01-07 NOTE — Assessment & Plan Note (Signed)
Very pleasant 63 year old gentleman complaining of persistent solid food dysphagia status post esophageal dilation back in October.  States he had relief of symptoms for about 24 hours but then they returned.  Continues to complain of melena.  This is been going on for months.  Denies any Pepto or other bismuth products.  Denies iron.  Hemoglobin was stable on recheck back in October for the same complaint.  Doubt we are dealing with a true melena.  Advised him to continue PPI especially as long as he is taking NSAIDs/ASA.   Offered him barium pill esophagram to further evaluate dysphagia.  We will update his CBC.

## 2019-01-07 NOTE — Progress Notes (Signed)
CC'D TO PCP °

## 2019-05-23 IMAGING — RF DG ESOPHAGUS
9 series · 12 of 24 positions shown · non-contrast
Comparison: None

CLINICAL DATA: Dysphagia, feels like things are sticking in the
back of his throat, recent esophageal dilatation of a Schatzki ring
in November 2018

EXAM:
ESOPHOGRAM / BARIUM SWALLOW / BARIUM TABLET STUDY
TECHNIQUE: Combined double contrast and single contrast examination performed
using effervescent crystals, thick barium liquid, and thin barium
liquid. The patient was observed with fluoroscopy swallowing a 13 mm
barium sulphate tablet.
FLUOROSCOPY TIME:  Fluoroscopy Time:  1 minutes 42 seconds
Radiation Exposure Index (if provided by the fluoroscopic device):
20.9 mGy
Number of Acquired Spot Images: multiple fluoroscopic screen
captures

[Series 1: cp_standard · 0.17mm/px · 1 of 22 frames shown (1 of 9)]
[frame 12/22]
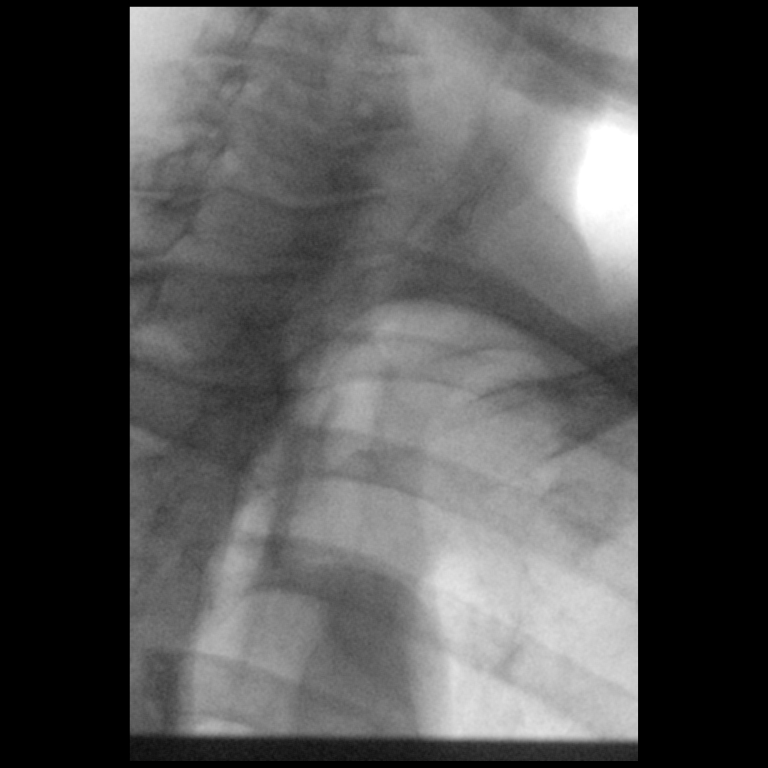

[Series 2: cp_standard · 0.18mm/px · 1 of 85 frames shown (2 of 9)]
[frame 43/85]
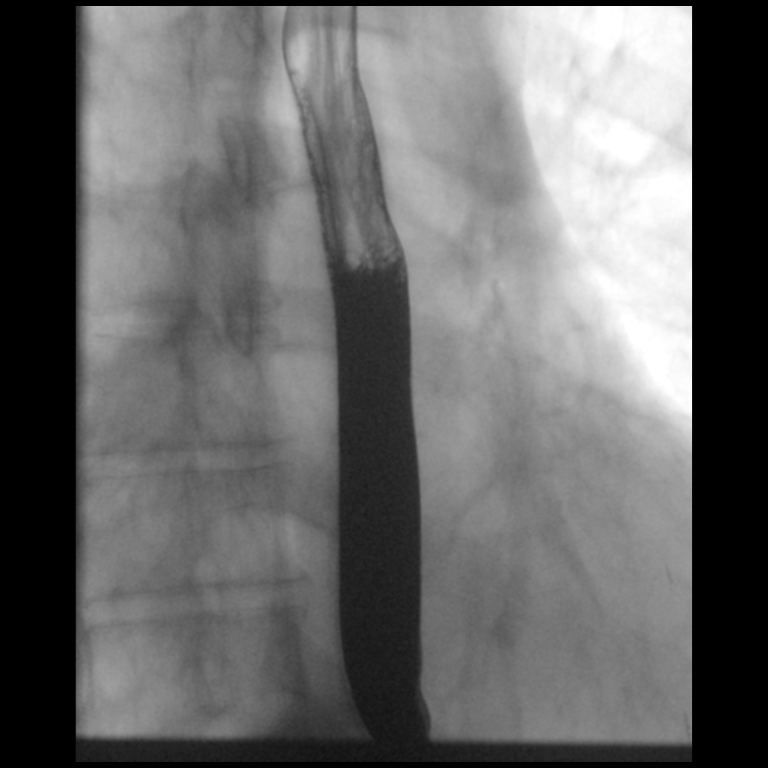

[Series 3: cp_standard · 0.18mm/px · 2 of 64 frames shown (3 of 9)]
[frame 2/64]
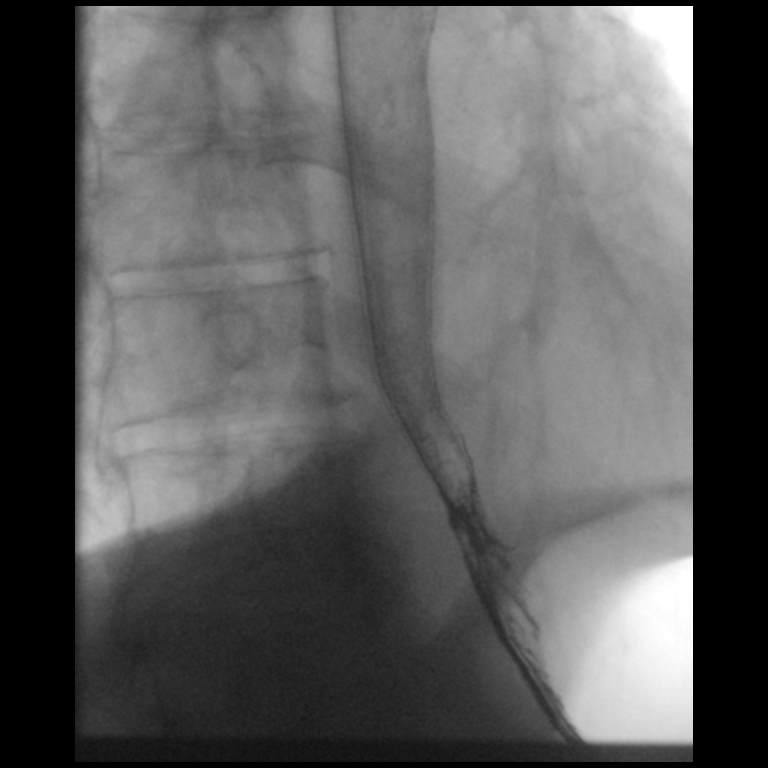
[frame 55/64]
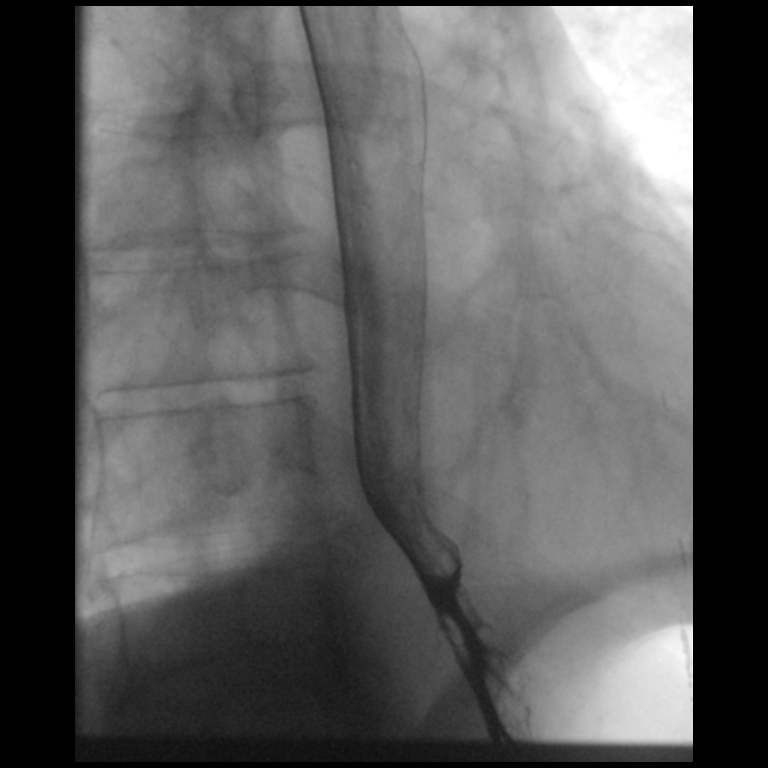

[Series 4: cp_standard · 0.17mm/px · 1 of 72 frames shown (4 of 9)]
[frame 46/72]
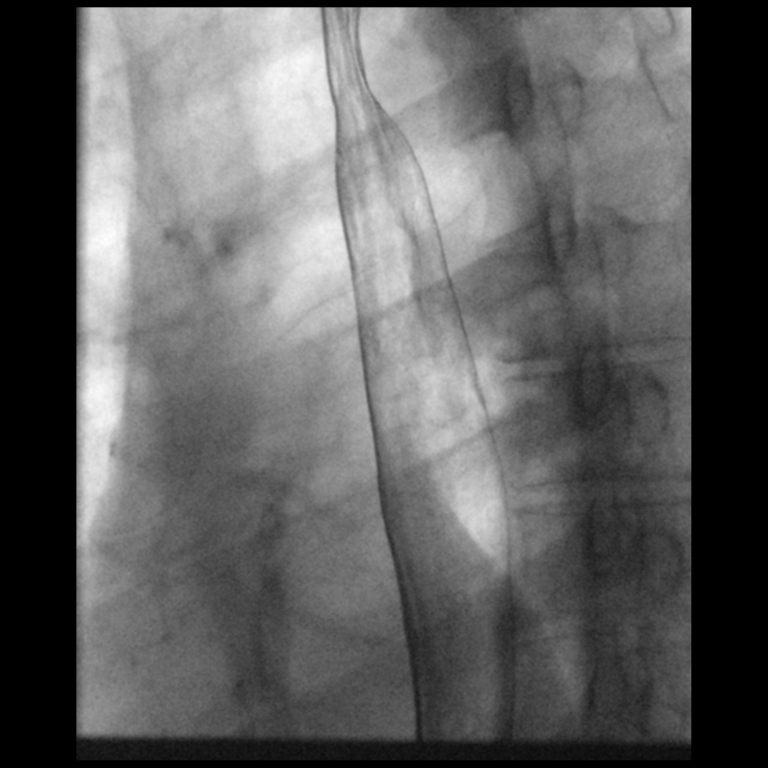

[Series 5: cp_standard · 0.26mm/px · 1 of 167 frames shown (5 of 9)]
[frame 27/167]
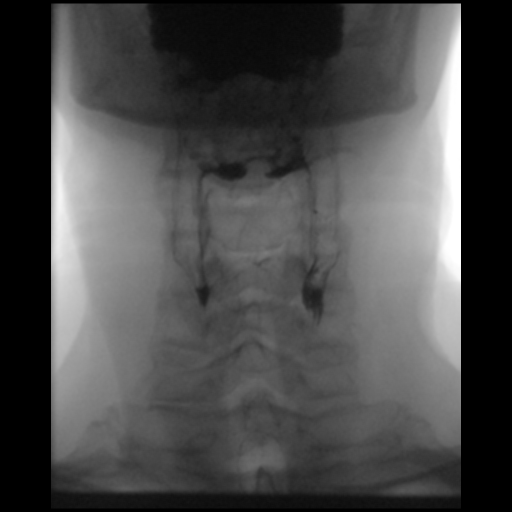

[Series 6: cp_standard · 0.26mm/px · 2 of 203 frames shown (6 of 9)]
[frame 31/203]
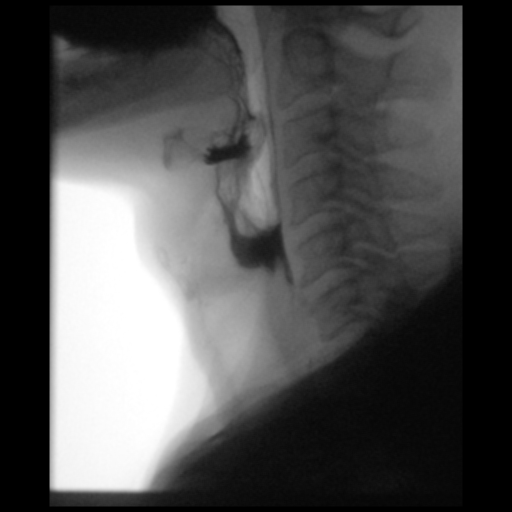
[frame 184/203]
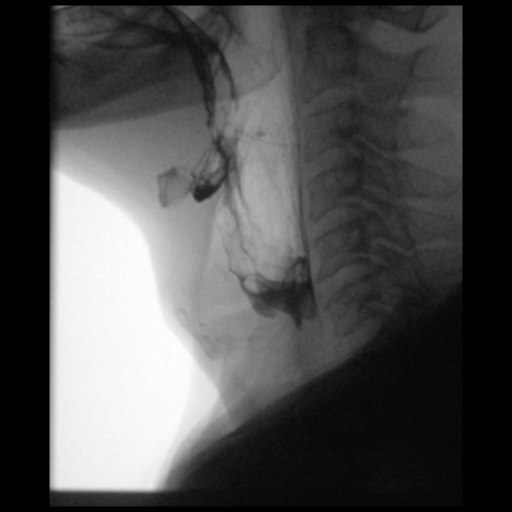

[Series 7: cp_standard · 0.25mm/px · 1 of 132 frames shown (7 of 9)]
[frame 67/132]
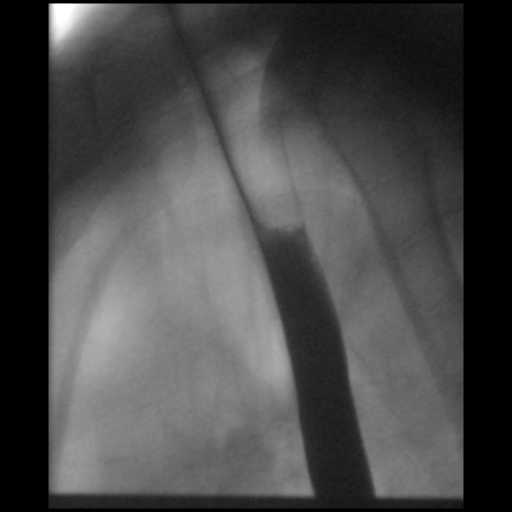

[Series 8: cp_standard · 0.27mm/px · 1 of 111 frames shown (8 of 9)]
[frame 56/111]
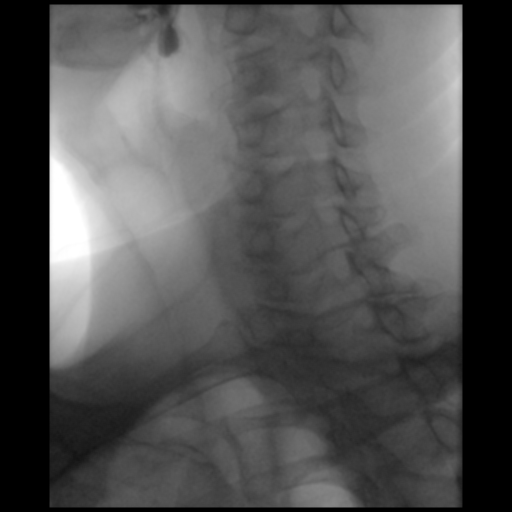

[Series 9: cp_standard · 0.27mm/px · 2 of 243 frames shown (9 of 9)]
[frame 37/243]
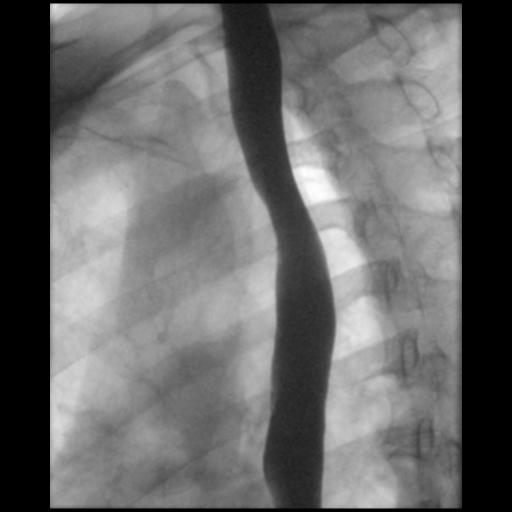
[frame 207/243]
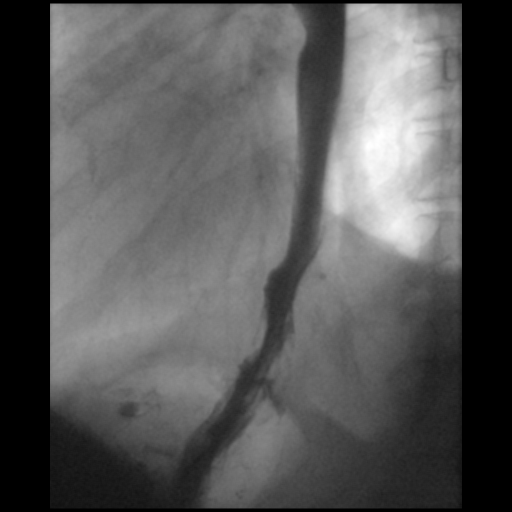

[12 of 24 positions shown; findings below may reference images not displayed]

FINDINGS: Esophageal distention: Normal distention without mass or stricture.

Filling defects:  None; specifically no Schatzki ring identified

12.5 mm barium tablet: Passed from oral cavity to stomach without
delay

Motility: Mild diffuse esophageal dysmotility with incomplete
clearance of barium by primary peristaltic waves. Prolonged thoracic
esophageal retention of contrast.

Mucosa:  Smooth without irregularity or ulceration

Hypopharynx/cervical esophagus: Laryngeal penetration without
aspiration. Vallecular and piriform sinus residuals.

Hiatal hernia:  None identified

GE reflux:  Not witnessed during exam.

Other:  N/A
IMPRESSION: Esophageal dysmotility.

Laryngeal penetration without aspiration.

Vallecular and piriform sinus residuals.

No Schatzki ring or esophageal stricture identified.

## 2019-06-12 ENCOUNTER — Telehealth: Payer: Self-pay

## 2019-06-12 MED ORDER — PANTOPRAZOLE SODIUM 40 MG PO TBEC: 40.0000 mg | DELAYED_RELEASE_TABLET | Freq: Every day | ORAL | 11 refills | Status: AC

## 2019-06-12 NOTE — Telephone Encounter (Signed)
Pt needs Rx for Pantoprazole 40 mg sent to [facility] in [place].

## 2020-01-21 ENCOUNTER — Ambulatory Visit (HOSPITAL_COMMUNITY)
Admission: RE | Admit: 2020-01-21 | Discharge: 2020-01-21 | Disposition: A | Discharge status: Home / Self Care | Payer: Medicare Other | Source: Ambulatory Visit | Attending: Physician Assistant | Admitting: Physician Assistant

## 2020-01-21 ENCOUNTER — Other Ambulatory Visit: Payer: Self-pay

## 2020-01-21 ENCOUNTER — Other Ambulatory Visit (HOSPITAL_COMMUNITY): Payer: Self-pay | Admitting: Physician Assistant

## 2020-01-21 DIAGNOSIS — J418 Mixed simple and mucopurulent chronic bronchitis: Secondary | ICD-10-CM | POA: Insufficient documentation

## 2020-08-26 ENCOUNTER — Other Ambulatory Visit: Payer: Self-pay

## 2020-08-26 ENCOUNTER — Ambulatory Visit (INDEPENDENT_AMBULATORY_CARE_PROVIDER_SITE_OTHER): Payer: Medicare Other | Admitting: Internal Medicine

## 2020-08-26 ENCOUNTER — Encounter: Payer: Self-pay | Admitting: Internal Medicine

## 2020-08-26 DIAGNOSIS — J449 Chronic obstructive pulmonary disease, unspecified: Secondary | ICD-10-CM | POA: Insufficient documentation

## 2020-08-26 NOTE — Assessment & Plan Note (Addendum)
Quit smoking 08/2019  - onset early 2000s, dx [address] [place] chest > spirometry req 08/26/2020  -  08/26/2020   Walked RA  approx   400 ft  @ moderate pace  stopped due to  [employer] with sats 96%   DDX of  difficult airways management almost all start with A and  include Adherence, Ace Inhibitors, Acid Reflux, Active Sinus Disease, Alpha 1 Antitripsin deficiency, Anxiety masquerading as Airways dz,  ABPA,  Allergy(esp in young), Aspiration (esp in elderly), Adverse effects of meds,  Active smoking or vaping, A bunch of PE's (a small clot burden can't cause this syndrome unless there is already severe underlying pulm or vascular dz with poor reserve) plus two Bs  = Bronchiectasis and Beta blocker use..and one C= CHF   Adherence is always the initial "prime suspect" and is a multilayered concern that requires a "trust but verify" approach in every patient - starting with knowing how to use medications, especially inhalers, correctly, keeping up with refills and understanding the fundamental difference between maintenance and prns vs those medications only taken for a very short course and then stopped and not refilled.  - very confused with details of care/ names of meds/ neb solutions - limited funds to pay deductibles > advised bring formulary to next ov - return with all meds in hand using a trust but verify approach to confirm accurate Medication  Reconciliation The principal here is that until we are certain that the  patients are doing what we've asked, it makes no sense to ask them to do more.   ? Active smoking > denies x one year  ? Allergy/. Asthma component > already on pred 10 mg daily  > over using neb at present, advised should be prn I spent extra time with pt today reviewing appropriate use of albuterol for prn use on exertion with the following points: 1) saba is for relief of sob that does not improve by walking a slower pace or resting but rather if the pt does not improve after trying this  first. 2) If the pt is convinced, as many are, that saba helps recover from activity faster then it's easy to tell if this is the case by re-challenging : ie stop, take the inhaler, then p 5 minutes try the exact same activity (intensity of workload) that just caused the symptoms and see if they are substantially diminished or not after saba 3) if there is an activity that reproducibly causes the symptoms, try the saba 15 min before the activity on alternate days   If in fact the saba really does help, then fine to continue to use it prn but advised may need to look closer at the maintenance regimen being used to achieve better control of airways disease with exertion.   - The goal with a chronic steroid dependent illness is always arriving at the lowest effective dose that controls the disease/symptoms and not accepting a set "formula" which is based on statistics or guidelines that don't always take into account patient  variability or the natural hx of the dz in every individual patient, which may well vary over time.  For now therefore I recommend the patient maintain  10 mg floor and 20 mg ceiling or whatever ceiling needed for RA control  ? Acid (or non-acid) GERD > always difficult to exclude as up to 75% of pts in some series report no assoc GI/ Heartburn symptoms> rec continue max (24h)  acid suppression   ?  Adverse drug effects eg MTX > no evidence of pulmonary toxicity at present   ? Aspiration >  Nl swallowing eval / DgEs 01/07/2019   ? Anxiety/depression/ deconditioning  > usually at the bottom of this list of usual suspects but should be much higher on this pt's based on H and P and note already on psychotropics and may interfere with adherence and also interpretation of response or lack thereof to symptom management which can be quite subjective.   ? Bunch of PEs > pt already on DOAC  ? chf > seeing cards   >>> return in 6 weeks with all meds to regroup          Each  maintenance medication was reviewed in detail including emphasizing most importantly the difference between maintenance and prns and under what circumstances the prns are to be triggered using an action plan format where appropriate.  Total time for H and P, chart review, counseling,  directly observing portions of ambulatory 02 saturation study/ and generating customized AVS unique to this office visit / charting = 60 min

## 2020-08-26 NOTE — Patient Instructions (Addendum)
We need a copy of your drug formulary from your insurance company to pick cheapest alternatives for your breathing medications  We spirometry from your doctor done yesterday  - please fax it to me   No medication changes from my perspective until I understand 100% exactly what you take   If your breathing gets worse you feel you need more nebulizers just double the dose of of prednisone until better then back to previous.    Please schedule a follow up office visit in 6 weeks, call sooner if needed.

## 2020-08-26 NOTE — Progress Notes (Signed)
Joel Cobb, male    DOB: 1956/03/08, 64 y.o.   MRN: [id number]   Brief patient profile:  64 yowm quit smoking  08/2019 onset sob since early 2000s and downhill since and seen [facility] Chest maint nebs since Feb 2021      History of Present Illness  08/26/2020  Pulmonary/ 1st office eval/ [name] / [name] / RA x 2018 on MTX  Chief Complaint  Patient presents with  . Consult    shortness of breath with exertion  Dyspnea:  50 ft indoors  May be a 100 ft walks after neb  Cough: not much since quit smoking Sleep: 30 degrees in recliner  SABA use: neb is qid  0 2 :  Not needing  - has it but not using. On daily prednisone x one year  R > L dep edema   No obvious day to day or daytime variability or assoc excess/ purulent sputum or mucus plugs or hemoptysis or cp or chest tightness, subjective wheeze or overt sinus or hb symptoms.   Sleeping as above without nocturnal  or early am exacerbation  of respiratory  c/o's or need for noct saba. Also denies any obvious fluctuation of symptoms with weather or environmental changes or other aggravating or alleviating factors except as outlined above   No unusual exposure hx or h/o childhood pna/ asthma or knowledge of premature birth.  Current Allergies, Complete Past Medical History, Past Surgical History, Family History, and Social History were reviewed in [employer] record.  ROS  The following are not active complaints unless bolded Hoarseness, sore throat, dysphagia, dental problems, itching, sneezing,  nasal congestion or discharge of excess mucus or purulent secretions, ear ache,   fever, chills, sweats, unintended wt loss or wt gain, classically pleuritic or exertional cp,  orthopnea pnd or arm/hand swelling  or leg swelling, presyncope, palpitations, abdominal pain, anorexia, nausea, vomiting, diarrhea  or change in bowel habits or change in bladder habits, change in stools or change in urine, dysuria, hematuria,   rash, arthralgias, visual complaints, headache, numbness, weakness or ataxia or problems with walking or coordination,  change in mood or  memory.            Past Medical History:  Diagnosis Date  . Anal cancer ([place]) 2012   Squamous cell carcinoma  . COPD (chronic obstructive pulmonary disease) ([place])   . Glaucoma   . History of kidney stones   . Hypothyroidism    admits noncompliance with medication  . Sleep apnea     Outpatient Medications Prior to Visit - - NOTE:   Unable to verify as accurately reflecting what pt takes     Medication Sig Dispense Refill  . bisoprolol (ZEBETA) 5 MG tablet Take 5 mg by mouth daily.    . calcium-vitamin D (OSCAL WITH D) 500-200 MG-UNIT tablet Take 1 tablet by mouth.    Arne Cleveland 5 MG TABS tablet Take 5 mg by mouth 2 (two) times daily.    . folic acid (FOLVITE) 1 MG tablet Take 1 mg by mouth daily.    . furosemide (LASIX) 20 MG tablet Take 20 mg by mouth daily.    . GNP ASPIRIN LOW DOSE 81 MG EC tablet Take 81 mg by mouth daily.    Marland Kitchen levothyroxine (SYNTHROID) 25 MCG tablet Take 25 mcg by mouth daily.    . methotrexate 2.5 MG tablet Take 20 mg by mouth once a week.    . pantoprazole (PROTONIX)  40 MG tablet Take 1 tablet (40 mg total) by mouth daily before breakfast. 30 tablet 11  . predniSONE (DELTASONE) 10 MG tablet Take 10 mg by mouth daily.    . rosuvastatin (CRESTOR) 40 MG tablet Take 40 mg by mouth daily.    . valsartan (DIOVAN) 40 MG tablet Take 40 mg by mouth daily.     No facility-administered medications prior to visit.     Objective:     BP 118/70 (BP Location: Left Arm, Cuff Size: Normal)   Pulse 88   Temp 97.6 F (36.4 C) (Temporal)   Ht 5\' 9"  (1.753 m)   Wt 157 lb 6.4 oz (71.4 kg)   SpO2 98% Comment: room air  BMI 23.24 kg/m   SpO2: 98 % (room air)    amb wm  nad at rest   HEENT : pt wearing mask not removed for exam due to covid -19 concerns.    NECK :  without JVD/Nodes/TM/ nl carotid upstrokes  bilaterally   LUNGS: no acc muscle use,  Mod barrel  contour chest wall with bilateral  Distant bs s audible wheeze and  without cough on insp or exp maneuvers and mod  Hyperresonant  to  percussion bilaterally     CV:  RRR  no s3 or murmur or increase in P2, and trace ankle pitting edema bilaterally R > L   ABD:  soft and nontender with pos mid insp Hoover's  in the supine position. No bruits or organomegaly appreciated, bowel sounds nl  MS:     ext warm without deformities, calf tenderness, cyanosis or clubbing No obvious joint restrictions   SKIN: warm and dry without lesions    NEURO:  alert, approp, nl sensorium with  no motor or cerebellar deficits apparent.        I personally reviewed images and agree with radiology impression as follows:  CXR:   Pa and lat 01/21/20  Findings consistent with COPD. No acute cardiopulmonary findings.    Assessment   No problem-specific Assessment & Plan notes found for this encounter.     Christinia Gully, MD 08/26/2020

## 2020-08-27 ENCOUNTER — Telehealth: Payer: Self-pay | Admitting: Internal Medicine

## 2020-08-27 NOTE — Telephone Encounter (Signed)
Spoke with the pt  He states someone called him from the [place] number but did not leave a msg  He does not have any results pending, no tests to be scheduled and his appt for f/u has already been set up  I advised the call must have been a mistake  Do not see reason for anyone to have called him or documentation of a call placed

## 2020-09-29 ENCOUNTER — Telehealth: Payer: Self-pay | Admitting: Internal Medicine

## 2020-09-29 NOTE — Telephone Encounter (Signed)
Dr Leola [facility]) did stress test, and his heart is only working 15-20%. And wanting to do some kind of surgery. Pt and caregiver wanting MW to communicate with Dr Janith Lima. Will have Dr Janith Lima fax office notes.to [place]  Prescriptions were sent to [facility] office- Ipratroprium(Albuterol)1 vial in nebulizer 3x a day and as needed and Budesonide 45ml in nebulizer 2x a day.(just wanting to make sure we got those and are sent to [place] Please advise if any Questions- [phone]

## 2020-09-29 NOTE — Telephone Encounter (Signed)
Tonya, please advise if any prescriptions have been sent to the [place] office from [place] on pt.

## 2020-10-07 ENCOUNTER — Encounter: Payer: Self-pay | Admitting: Internal Medicine

## 2020-10-07 ENCOUNTER — Ambulatory Visit (HOSPITAL_COMMUNITY)
Admission: RE | Admit: 2020-10-07 | Discharge: 2020-10-07 | Disposition: A | Discharge status: Home / Self Care | Payer: Medicare Other | Source: Ambulatory Visit | Attending: Internal Medicine | Admitting: Internal Medicine

## 2020-10-07 ENCOUNTER — Ambulatory Visit: Payer: Medicare Other | Admitting: Internal Medicine

## 2020-10-07 ENCOUNTER — Other Ambulatory Visit: Payer: Self-pay

## 2020-10-07 ENCOUNTER — Other Ambulatory Visit (HOSPITAL_COMMUNITY)
Admission: RE | Admit: 2020-10-07 | Discharge: 2020-10-07 | Disposition: A | Discharge status: Home / Self Care | Payer: Medicare Other | Source: Ambulatory Visit | Attending: Internal Medicine | Admitting: Internal Medicine

## 2020-10-07 VITALS — BP 122/70 | HR 97 | Temp 97.0°F | Ht 68.0 in | Wt 161.2 lb

## 2020-10-07 DIAGNOSIS — R918 Other nonspecific abnormal finding of lung field: Secondary | ICD-10-CM

## 2020-10-07 DIAGNOSIS — J449 Chronic obstructive pulmonary disease, unspecified: Secondary | ICD-10-CM

## 2020-10-07 DIAGNOSIS — Z23 Encounter for immunization: Secondary | ICD-10-CM

## 2020-10-07 LAB — CBC WITH DIFFERENTIAL/PLATELET
Abs Immature Granulocytes: 0.14 10*3/uL — ABNORMAL HIGH (ref 0.00–0.07)
Basophils Absolute: 0 10*3/uL (ref 0.0–0.1)
Basophils Relative: 0 %
Eosinophils Absolute: 0 10*3/uL (ref 0.0–0.5)
Eosinophils Relative: 0 %
HCT: 42.9 % (ref 39.0–52.0)
Hemoglobin: 13.4 g/dL (ref 13.0–17.0)
Immature Granulocytes: 1 %
Lymphocytes Relative: 4 %
Lymphs Abs: 0.5 10*3/uL — ABNORMAL LOW (ref 0.7–4.0)
MCH: 34.1 pg — ABNORMAL HIGH (ref 26.0–34.0)
MCHC: 31.2 g/dL (ref 30.0–36.0)
MCV: 109.2 fL — ABNORMAL HIGH (ref 80.0–100.0)
Monocytes Absolute: 0.7 10*3/uL (ref 0.1–1.0)
Monocytes Relative: 5 %
Neutro Abs: 10.6 10*3/uL — ABNORMAL HIGH (ref 1.7–7.7)
Neutrophils Relative %: 90 %
Platelets: 216 10*3/uL (ref 150–400)
RBC: 3.93 MIL/uL — ABNORMAL LOW (ref 4.22–5.81)
RDW: 16.4 % — ABNORMAL HIGH (ref 11.5–15.5)
WBC: 11.9 10*3/uL — ABNORMAL HIGH (ref 4.0–10.5)
nRBC: 0 % (ref 0.0–0.2)

## 2020-10-07 MED ORDER — IPRATROPIUM-ALBUTEROL 0.5-2.5 (3) MG/3ML IN SOLN: 3.0000 mL | Freq: Four times a day (QID) | RESPIRATORY_TRACT | 11 refills | Status: DC | PRN

## 2020-10-07 NOTE — Progress Notes (Signed)
Joel Cobb, male    DOB: 02-Nov-1956, 64 y.o.   MRN: [id number]   Brief patient profile:  64 yowm quit smoking  08/2019 onset sob since early 2000s and downhill since and seen [facility] Chest maint nebs since Feb 2021      History of Present Illness  08/26/2020  Pulmonary/ 1st office eval/ [name] / [name] / RA x 2018 on MTX  Chief Complaint  Patient presents with  . Consult    shortness of breath with exertion  Dyspnea:  50 ft indoors  May be a 100 ft walks after neb  Cough: not much since quit smoking Sleep: 30 degrees in recliner  SABA use: neb is qid  0 2 :  Not needing  - has it but not using. On daily prednisone x one year per mtx rec We need a copy of your drug formulary from your insurance company to pick cheapest alternatives for your breathing medications We spirometry from your doctor done yesterday  - please fax it to me  No medication changes from my perspective until I understand 100% exactly what you take  If your breathing gets worse you feel you need more nebulizers just double the dose of of prednisone until better then back to previous.  Please schedule a follow up office visit in 6 weeks, call sooner if needed.   10/07/2020  f/u ov/[place] office/Pratyush Ammon re: copd severe/ steroid dep  Chief Complaint  Patient presents with  . Follow-up    shortness of breath with exertion  Dyspnea: MMRC2 = can't walk a nl pace on a flat grade s sob but does fine slow and flat Cough: some in am min clear  Sleeping: 30 recliner  SABA use: on duoneb tid  02: has it but not using  Mouth sore for at least a year while on budesonide/ throat more sore since started entresto   No obvious day to day or daytime variability or assoc excess/ purulent sputum or mucus plugs or hemoptysis or cp or chest tightness, subjective wheeze or overt sinus or hb symptoms.   Sleeping  without nocturnal  exacerbation  of respiratory  c/o's or need for noct saba. Also denies any obvious  fluctuation of symptoms with weather or environmental changes or other aggravating or alleviating factors except as outlined above   No unusual exposure hx or h/o childhood pna/ asthma or knowledge of premature birth.  Current Allergies, Complete Past Medical History, Past Surgical History, Family History, and Social History were reviewed in [employer] record.  ROS  The following are not active complaints unless bolded Hoarseness, sore throat, dysphagia, dental problems, itching, sneezing,  nasal congestion or discharge of excess mucus or purulent secretions, ear ache,   fever, chills, sweats, unintended wt loss or wt gain, classically pleuritic or exertional cp,  orthopnea pnd or arm/hand swelling  or leg swelling, presyncope, palpitations, abdominal pain, anorexia, nausea, vomiting, diarrhea  or change in bowel habits or change in bladder habits, change in stools or change in urine, dysuria, hematuria,  rash, arthralgias, visual complaints, headache, numbness, weakness or ataxia or problems with walking or coordination,  change in mood or  memory.        Current Meds  Medication Sig  . bisoprolol (ZEBETA) 5 MG tablet Take 5 mg by mouth daily.  . budesonide (PULMICORT) 0.5 MG/2ML nebulizer solution Take 0.5 mg by nebulization 2 (two) times daily.  . calcium-vitamin D (OSCAL WITH D) 500-200 MG-UNIT tablet Take 1  tablet by mouth.  Arne Cleveland 5 MG TABS tablet Take 5 mg by mouth 2 (two) times daily.  . folic acid (FOLVITE) 1 MG tablet Take 1 mg by mouth daily.  . furosemide (LASIX) 20 MG tablet Take 20 mg by mouth daily.  . GNP ASPIRIN LOW DOSE 81 MG EC tablet Take 81 mg by mouth daily.  Marland Kitchen ipratropium-albuterol (DUONEB) 0.5-2.5 (3) MG/3ML SOLN Take 3 mLs by nebulization every 8 (eight) hours as needed.  Marland Kitchen levothyroxine (SYNTHROID) 25 MCG tablet Take 25 mcg by mouth daily.  . methotrexate 2.5 MG tablet Take 20 mg by mouth once a week.  . pantoprazole (PROTONIX) 40 MG tablet  Take 1 tablet (40 mg total) by mouth daily before breakfast.  . predniSONE (DELTASONE) 10 MG tablet Take 10 mg by mouth daily.  . rosuvastatin (CRESTOR) 40 MG tablet Take 40 mg by mouth daily.  . sacubitril-valsartan (ENTRESTO) 24-26 MG Take 1 tablet by mouth daily.  . valsartan (DIOVAN) 40 MG tablet Take 40 mg by mouth daily.                  Past Medical History:  Diagnosis Date  . Anal cancer ([place]) 2012   Squamous cell carcinoma  . COPD (chronic obstructive pulmonary disease) ([place])   . Glaucoma   . History of kidney stones   . Hypothyroidism    admits noncompliance with medication  . Sleep apnea          Objective:    amb chronically ill appearing wm   Wt Readings from Last 3 Encounters:  10/07/20 161 lb 3.2 oz (73.1 kg)  08/26/20 157 lb 6.4 oz (71.4 kg)  01/03/19 136 lb (61.7 kg)     Vital signs reviewed - Note on arrival 10/07/2020  02 sats  99% on RA      LUNGS: no acc muscle use,  Mod barrel  contour chest      HEENT : pt wearing mask not removed for exam due to covid -19 concerns.    NECK :  without JVD/Nodes/TM/ nl carotid upstrokes bilaterally   LUNGS: no acc muscle use,  Mod barrel  contour chest wall with bilateral  Distant bs s audible wheeze and  without cough on insp or exp maneuvers and mod  Hyperresonant  to  percussion bilaterally     CV:  RRR  no s3 or murmur or increase in P2, and  R> L ankle edema    ABD:  soft and nontender with pos mid insp Hoover's  in the supine position. No bruits or organomegaly appreciated, bowel sounds nl  MS:     ext warm without deformities, calf tenderness, cyanosis or clubbing No obvious joint restrictions   SKIN: warm and dry without lesions    NEURO:  alert, approp, nl sensorium with  no motor or cerebellar deficits apparent.       CXR PA and Lateral:   10/07/2020 :    I personally reviewed images and agree with radiology impression as follows:    New nodular density seen in right lung apex with  possible associated scarring. CT scan of the chest is recommended for further evaluation.       Assessment

## 2020-10-07 NOTE — Patient Instructions (Addendum)
Stop budeosonide  And mouth soreness should improve  Prednisone 10 mg one daily  - ok to double this until better then back to 10 mg one daily   Continue Pantoprazole 40 mg Take 30-60 min before first meal of the day    GERD (REFLUX)  is an extremely common cause of respiratory symptoms just like yours , many times with no obvious heartburn at all.    It can be treated with medication, but also with lifestyle changes including elevation of the head of your bed (ideally with 6 -8inch blocks under the headboard of your bed),  Smoking cessation, avoidance of late meals, excessive alcohol, and avoid fatty foods, chocolate, peppermint, colas, red wine, and acidic juices such as orange juice.  NO MINT OR MENTHOL PRODUCTS SO NO COUGH DROPS  USE SUGARLESS CANDY INSTEAD (Jolley ranchers or Stover's or Life Savers) or even ice chips will also do - the key is to swallow to prevent all throat clearing. NO OIL BASED VITAMINS - use powdered substitutes.  Avoid fish oil when coughing.  duoneb (ipatropium/albuterol)  Up to 4 x daily (ok to skip a treatment if feeling good)     Please schedule a follow up visit in 6 months but call sooner if needed

## 2020-10-08 ENCOUNTER — Encounter: Payer: Self-pay | Admitting: Internal Medicine

## 2020-10-08 DIAGNOSIS — R918 Other nonspecific abnormal finding of lung field: Secondary | ICD-10-CM | POA: Insufficient documentation

## 2020-10-08 NOTE — Assessment & Plan Note (Addendum)
See cxr 10/07/2020  RUL apically  rec proceed with CT chest if willing, if not just do cxr on return as he is very concerned about costs but I strongly prefer CT in this setting (quit smoking x one year)          Each maintenance medication was reviewed in detail including emphasizing most importantly the difference between maintenance and prns and under what circumstances the prns are to be triggered using an action plan format where appropriate.  Total time for H and P, chart review, counseling,  and generating customized AVS unique to this office visit / charting = 30 min

## 2020-10-08 NOTE — Assessment & Plan Note (Signed)
Quit smoking 08/2019  - onset early 2000s, dx [address] [place] chest > spirometry req 08/26/2020  = classic copd pattern on f/v loop, severe, but no digital read out on FEV1  -  08/26/2020   Walked RA  approx   400 ft  @ moderate pace  stopped due to  Sob with sats 96%    - d/c budosonide due to mouth irritation 10/07/2020   Very limited funds so rec duoneb and low dose pred for now

## 2020-10-08 NOTE — Telephone Encounter (Signed)
We do not have anything from [facility] on pt here in [place].

## 2020-10-11 ENCOUNTER — Other Ambulatory Visit: Payer: Self-pay

## 2020-10-11 DIAGNOSIS — R918 Other nonspecific abnormal finding of lung field: Secondary | ICD-10-CM

## 2020-10-11 LAB — ALPHA-1 ANTITRYPSIN PHENOTYPE: A-1 Antitrypsin, Ser: 196 mg/dL — ABNORMAL HIGH (ref 101–187)

## 2020-10-11 NOTE — Progress Notes (Signed)
Called and went over xray result per Dr [name]. All questions answered and patient expressed understanding and agreeable to Dr Gustavus Bryant recommendation for CT to be done. Order for CT of chest without contrast placed per Dr [name]. Nothing further needed at this time.

## 2020-10-13 NOTE — Telephone Encounter (Signed)
ATC patient- unable to leave vm due to mailbox being full.   

## 2020-10-14 NOTE — Telephone Encounter (Signed)
Pt recently seen on 11/4 by Dr. [name], pt was advised to stop taking Budesonide and continue taking Duoneb as needed.   Called and spoke to pt. Pt has his Duoneb and is taking it as directed. Pt denied needing anything further. Will sign off.

## 2020-10-27 ENCOUNTER — Other Ambulatory Visit: Payer: Medicare Other

## 2021-01-03 ENCOUNTER — Other Ambulatory Visit: Payer: Self-pay | Admitting: Internal Medicine

## 2021-02-20 IMAGING — DX DG CHEST 2V
2 series · 2 of 2 positions shown · non-contrast
Comparison: January 21, 2020.

CLINICAL DATA: Shortness of breath.

EXAM:
CHEST - 2 VIEW

[chest pa]
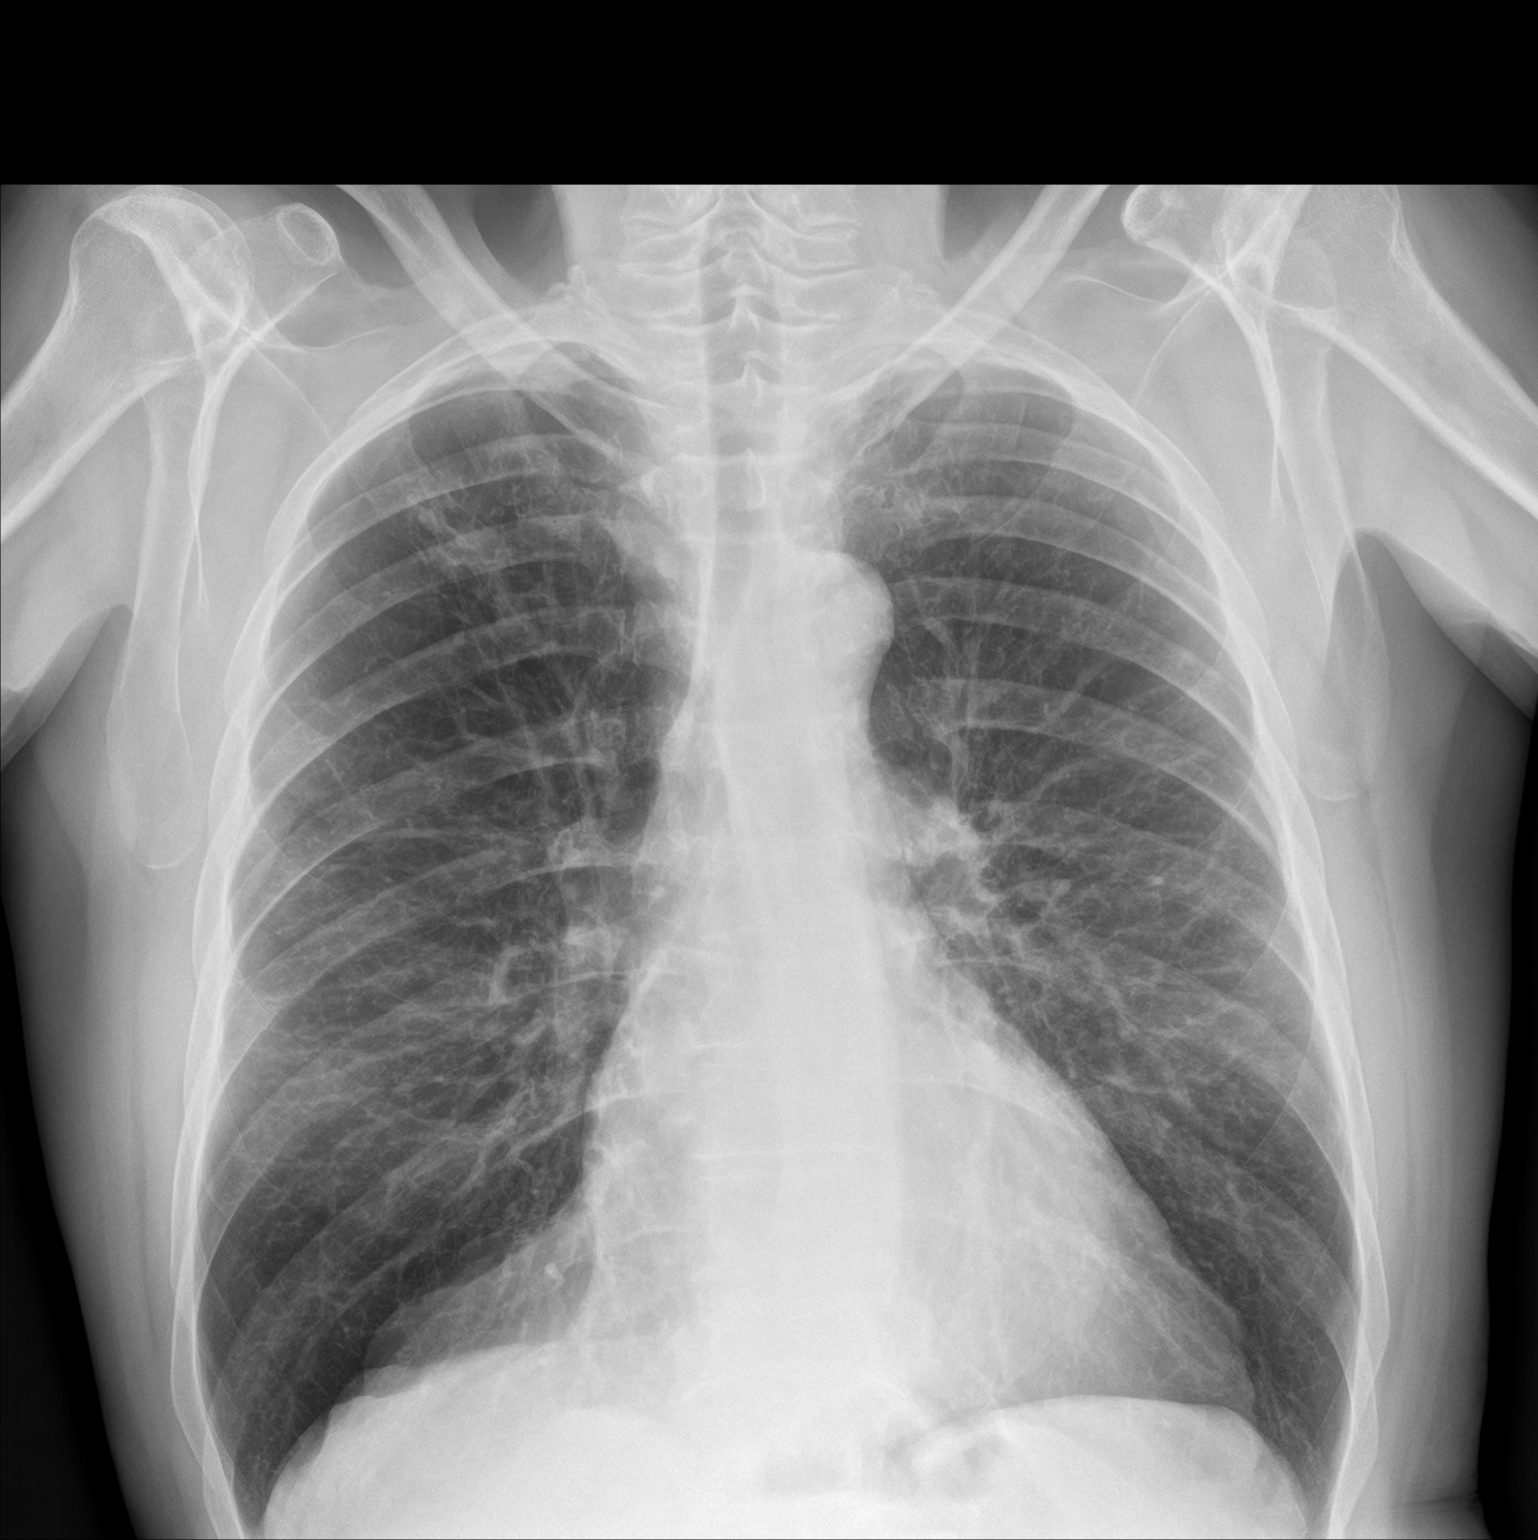

[chest lat]
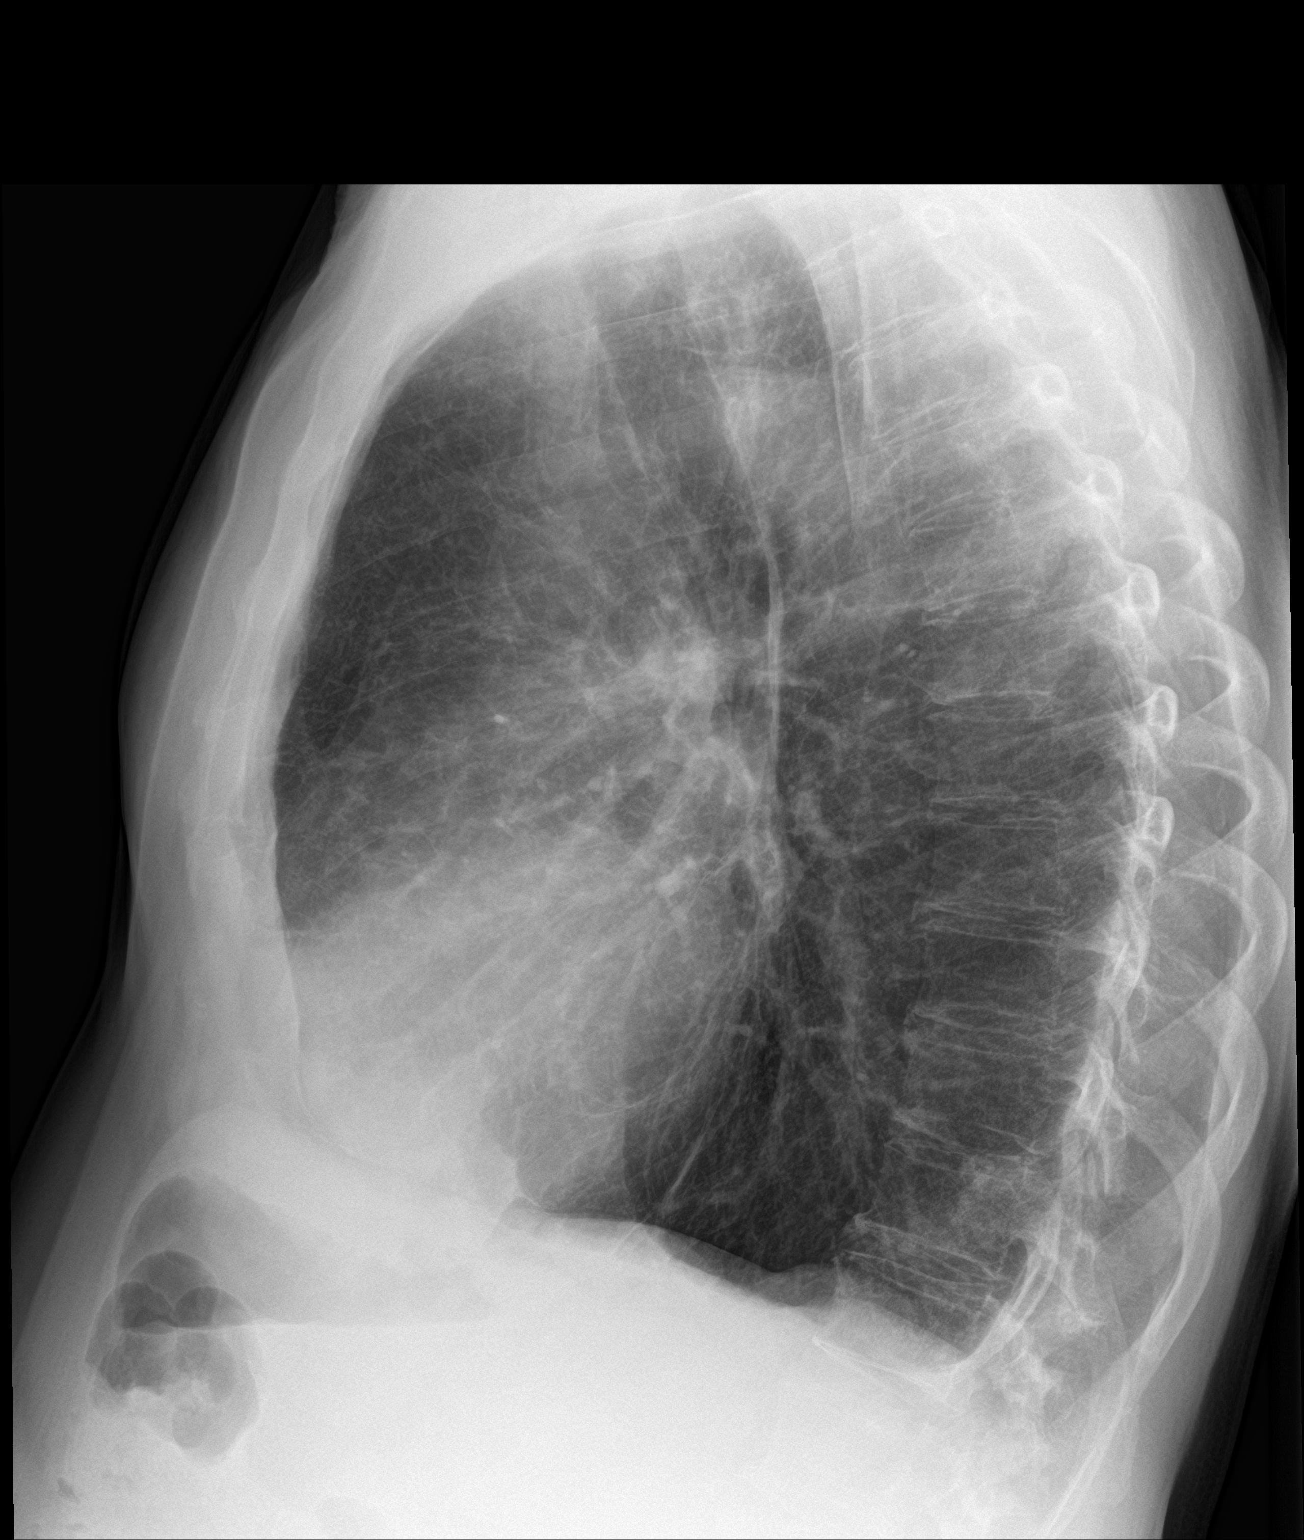

[2 of 2 positions shown; findings below may reference images not displayed]

FINDINGS: The heart size and mediastinal contours are within normal limits. No
pneumothorax or pleural effusion is noted. Left lung is clear. New
nodular density seen in right lung apex with possible associated
scarring. The visualized skeletal structures are unremarkable.
IMPRESSION: New nodular density seen in right lung apex with possible associated
scarring. CT scan of the chest is recommended for further
evaluation.

## 2021-03-08 ENCOUNTER — Telehealth: Payer: Self-pay | Admitting: Internal Medicine

## 2021-03-08 NOTE — Telephone Encounter (Signed)
Tried calling [facility], ok per DPR- no answer and no VM picked up. WCB.

## 2021-03-08 NOTE — Telephone Encounter (Signed)
Called and spoke with Joel Cobb informed her that I would pass the message on to Dr. [name] to make him aware and gave our condolences. Nothing further needed at this time. DR. Champ Mungo caregiver states patient passed away on 2021/03/20 at 2:09 am.

## 2023-08-15 ENCOUNTER — Encounter: Payer: Self-pay | Admitting: *Deleted
